# Patient Record
Sex: Female | Born: 1955
Health system: Southern US, Community
[De-identification: ages and names within clinical notes are randomized; demographics above are authoritative.]

## PROBLEM LIST (undated history)

## (undated) DIAGNOSIS — M719 Bursopathy, unspecified: Secondary | ICD-10-CM

## (undated) DIAGNOSIS — M545 Low back pain: Secondary | ICD-10-CM

## (undated) DIAGNOSIS — Z789 Other specified health status: Secondary | ICD-10-CM

## (undated) DIAGNOSIS — Z9889 Other specified postprocedural states: Secondary | ICD-10-CM

## (undated) DIAGNOSIS — M67919 Unspecified disorder of synovium and tendon, unspecified shoulder: Secondary | ICD-10-CM

## (undated) DIAGNOSIS — F329 Major depressive disorder, single episode, unspecified: Secondary | ICD-10-CM

## (undated) DIAGNOSIS — R112 Nausea with vomiting, unspecified: Secondary | ICD-10-CM

## (undated) HISTORY — DX: Unspecified disorder of synovium and tendon, unspecified shoulder: M67.919

## (undated) HISTORY — DX: Low back pain: M54.5

## (undated) HISTORY — PX: BREAST BIOPSY: SHX20

## (undated) HISTORY — DX: Bursopathy, unspecified: M71.9

## (undated) HISTORY — DX: Major depressive disorder, single episode, unspecified: F32.9

---

## 1975-04-25 HISTORY — PX: APPENDECTOMY: SHX54

## 1999-01-04 ENCOUNTER — Other Ambulatory Visit: Admission: RE | Admit: 1999-01-04 | Discharge: 1999-01-04 | Payer: Self-pay

## 1999-10-12 ENCOUNTER — Other Ambulatory Visit: Admission: RE | Admit: 1999-10-12 | Discharge: 1999-10-12 | Payer: Self-pay | Admitting: *Deleted

## 2000-03-02 ENCOUNTER — Encounter: Payer: Self-pay | Admitting: Family Medicine

## 2000-03-02 ENCOUNTER — Encounter: Admission: RE | Admit: 2000-03-02 | Discharge: 2000-03-02 | Payer: Self-pay | Admitting: Family Medicine

## 2000-03-07 ENCOUNTER — Encounter: Payer: Self-pay | Admitting: Family Medicine

## 2000-03-07 ENCOUNTER — Encounter: Admission: RE | Admit: 2000-03-07 | Discharge: 2000-03-07 | Payer: Self-pay | Admitting: Family Medicine

## 2000-08-27 ENCOUNTER — Encounter: Admission: RE | Admit: 2000-08-27 | Discharge: 2000-08-27 | Payer: Self-pay | Admitting: Family Medicine

## 2000-08-27 ENCOUNTER — Encounter: Payer: Self-pay | Admitting: Family Medicine

## 2001-03-15 ENCOUNTER — Encounter: Admission: RE | Admit: 2001-03-15 | Discharge: 2001-03-15 | Payer: Self-pay | Admitting: Family Medicine

## 2001-03-15 ENCOUNTER — Encounter: Payer: Self-pay | Admitting: Family Medicine

## 2001-10-17 ENCOUNTER — Encounter: Admission: RE | Admit: 2001-10-17 | Discharge: 2001-10-17 | Payer: Self-pay | Admitting: Family Medicine

## 2001-10-17 ENCOUNTER — Encounter: Payer: Self-pay | Admitting: Family Medicine

## 2002-03-18 ENCOUNTER — Encounter: Admission: RE | Admit: 2002-03-18 | Discharge: 2002-03-18 | Payer: Self-pay | Admitting: Obstetrics and Gynecology

## 2002-03-18 ENCOUNTER — Encounter: Payer: Self-pay | Admitting: Obstetrics and Gynecology

## 2002-05-20 LAB — HM DEXA SCAN: HM Dexa Scan: BORDERLINE

## 2002-05-27 ENCOUNTER — Other Ambulatory Visit: Admission: RE | Admit: 2002-05-27 | Discharge: 2002-05-27 | Payer: Self-pay | Admitting: Obstetrics and Gynecology

## 2002-12-05 ENCOUNTER — Encounter: Payer: Self-pay | Admitting: Family Medicine

## 2002-12-05 ENCOUNTER — Encounter: Admission: RE | Admit: 2002-12-05 | Discharge: 2002-12-05 | Payer: Self-pay | Admitting: Family Medicine

## 2003-03-23 ENCOUNTER — Encounter: Admission: RE | Admit: 2003-03-23 | Discharge: 2003-03-23 | Payer: Self-pay | Admitting: Obstetrics and Gynecology

## 2003-04-01 ENCOUNTER — Encounter: Admission: RE | Admit: 2003-04-01 | Discharge: 2003-04-01 | Payer: Self-pay | Admitting: Obstetrics and Gynecology

## 2003-06-23 ENCOUNTER — Other Ambulatory Visit: Admission: RE | Admit: 2003-06-23 | Discharge: 2003-06-23 | Payer: Self-pay | Admitting: Obstetrics and Gynecology

## 2003-11-17 ENCOUNTER — Encounter: Admission: RE | Admit: 2003-11-17 | Discharge: 2003-11-17 | Payer: Self-pay | Admitting: Family Medicine

## 2004-05-20 LAB — HM COLONOSCOPY: HM Colonoscopy: NEGATIVE

## 2004-08-02 ENCOUNTER — Other Ambulatory Visit: Admission: RE | Admit: 2004-08-02 | Discharge: 2004-08-02 | Payer: Self-pay | Admitting: Obstetrics and Gynecology

## 2004-08-30 ENCOUNTER — Ambulatory Visit (HOSPITAL_COMMUNITY): Admission: RE | Admit: 2004-08-30 | Discharge: 2004-08-30 | Payer: Self-pay | Admitting: Obstetrics and Gynecology

## 2005-03-28 ENCOUNTER — Ambulatory Visit (HOSPITAL_COMMUNITY): Admission: RE | Admit: 2005-03-28 | Discharge: 2005-03-28 | Payer: Self-pay | Admitting: Orthopedic Surgery

## 2005-03-28 ENCOUNTER — Ambulatory Visit (HOSPITAL_BASED_OUTPATIENT_CLINIC_OR_DEPARTMENT_OTHER): Admission: RE | Admit: 2005-03-28 | Discharge: 2005-03-28 | Payer: Self-pay | Admitting: Orthopedic Surgery

## 2006-08-14 ENCOUNTER — Inpatient Hospital Stay (HOSPITAL_COMMUNITY): Admission: EM | Admit: 2006-08-14 | Discharge: 2006-08-18 | Payer: Self-pay | Admitting: Emergency Medicine

## 2006-08-15 ENCOUNTER — Ambulatory Visit: Payer: Self-pay | Admitting: Internal Medicine

## 2006-08-15 ENCOUNTER — Encounter: Payer: Self-pay | Admitting: Internal Medicine

## 2006-09-03 ENCOUNTER — Encounter: Payer: Self-pay | Admitting: Family Medicine

## 2008-06-16 ENCOUNTER — Encounter: Admission: RE | Admit: 2008-06-16 | Discharge: 2008-06-16 | Payer: Self-pay | Admitting: Obstetrics and Gynecology

## 2008-06-22 LAB — CONVERTED CEMR LAB: Pap Smear: NORMAL

## 2008-11-13 ENCOUNTER — Ambulatory Visit: Payer: Self-pay | Admitting: Family Medicine

## 2008-11-13 DIAGNOSIS — F3289 Other specified depressive episodes: Secondary | ICD-10-CM

## 2008-11-13 DIAGNOSIS — M545 Low back pain, unspecified: Secondary | ICD-10-CM | POA: Insufficient documentation

## 2008-11-13 DIAGNOSIS — F329 Major depressive disorder, single episode, unspecified: Secondary | ICD-10-CM

## 2008-11-13 HISTORY — DX: Low back pain, unspecified: M54.50

## 2008-11-13 HISTORY — DX: Major depressive disorder, single episode, unspecified: F32.9

## 2008-11-13 HISTORY — DX: Other specified depressive episodes: F32.89

## 2009-10-19 ENCOUNTER — Ambulatory Visit: Payer: Self-pay | Admitting: Family Medicine

## 2009-10-19 LAB — CONVERTED CEMR LAB
ALT: 17 units/L (ref 0–35)
AST: 22 units/L (ref 0–37)
Albumin: 4.4 g/dL (ref 3.5–5.2)
Alkaline Phosphatase: 54 units/L (ref 39–117)
BUN: 15 mg/dL (ref 6–23)
Basophils Absolute: 0.1 10*3/uL (ref 0.0–0.1)
Basophils Relative: 1.3 % (ref 0.0–3.0)
Bilirubin Urine: NEGATIVE
Bilirubin, Direct: 0.1 mg/dL (ref 0.0–0.3)
Blood in Urine, dipstick: NEGATIVE
CO2: 29 meq/L (ref 19–32)
Calcium: 9.7 mg/dL (ref 8.4–10.5)
Chloride: 109 meq/L (ref 96–112)
Cholesterol: 182 mg/dL (ref 0–200)
Creatinine, Ser: 0.9 mg/dL (ref 0.4–1.2)
Eosinophils Absolute: 0.1 10*3/uL (ref 0.0–0.7)
Eosinophils Relative: 2.6 % (ref 0.0–5.0)
GFR calc non Af Amer: 68.54 mL/min (ref >60)
Glucose, Bld: 92 mg/dL (ref 70–99)
Glucose, Urine, Semiquant: NEGATIVE
HCT: 37 % (ref 36.0–46.0)
HDL: 73.9 mg/dL (ref >39.00)
Hemoglobin: 12.7 g/dL (ref 12.0–15.0)
Ketones, urine, test strip: NEGATIVE
LDL Cholesterol: 98 mg/dL (ref 0–99)
Lymphocytes Relative: 32.3 % (ref 12.0–46.0)
Lymphs Abs: 1.6 10*3/uL (ref 0.7–4.0)
MCHC: 34.5 g/dL (ref 30.0–36.0)
MCV: 94.9 fL (ref 78.0–100.0)
Monocytes Absolute: 0.4 10*3/uL (ref 0.1–1.0)
Monocytes Relative: 7.6 % (ref 3.0–12.0)
Neutro Abs: 2.8 10*3/uL (ref 1.4–7.7)
Neutrophils Relative %: 56.2 % (ref 43.0–77.0)
Nitrite: NEGATIVE
Platelets: 212 10*3/uL (ref 150.0–400.0)
Potassium: 5.5 meq/L — ABNORMAL HIGH (ref 3.5–5.1)
Protein, U semiquant: NEGATIVE
RBC: 3.9 M/uL (ref 3.87–5.11)
RDW: 13.8 % (ref 11.5–14.6)
Sodium: 143 meq/L (ref 135–145)
Specific Gravity, Urine: 1.02
TSH: 0.91 microintl units/mL (ref 0.35–5.50)
Total Bilirubin: 0.5 mg/dL (ref 0.3–1.2)
Total CHOL/HDL Ratio: 2
Total Protein: 7.3 g/dL (ref 6.0–8.3)
Triglycerides: 51 mg/dL (ref 0.0–149.0)
Urobilinogen, UA: 0.2
VLDL: 10.2 mg/dL (ref 0.0–40.0)
WBC: 4.9 10*3/uL (ref 4.5–10.5)
pH: 5.5

## 2009-10-22 ENCOUNTER — Ambulatory Visit: Payer: Self-pay | Admitting: Family Medicine

## 2010-03-23 ENCOUNTER — Ambulatory Visit: Payer: Self-pay | Admitting: Family Medicine

## 2010-03-23 DIAGNOSIS — M67919 Unspecified disorder of synovium and tendon, unspecified shoulder: Secondary | ICD-10-CM

## 2010-03-23 DIAGNOSIS — M719 Bursopathy, unspecified: Secondary | ICD-10-CM

## 2010-03-23 HISTORY — DX: Unspecified disorder of synovium and tendon, unspecified shoulder: M67.919

## 2010-03-23 HISTORY — DX: Unspecified disorder of synovium and tendon, unspecified shoulder: M71.9

## 2010-04-11 ENCOUNTER — Encounter: Payer: Self-pay | Admitting: Family Medicine

## 2010-04-20 ENCOUNTER — Telehealth: Payer: Self-pay | Admitting: Family Medicine

## 2010-04-27 ENCOUNTER — Ambulatory Visit
Admission: RE | Admit: 2010-04-27 | Discharge: 2010-04-27 | Payer: Self-pay | Source: Home / Self Care | Attending: Family Medicine | Admitting: Family Medicine

## 2010-04-27 DIAGNOSIS — T881XXA Other complications following immunization, not elsewhere classified, initial encounter: Secondary | ICD-10-CM | POA: Insufficient documentation

## 2010-05-15 ENCOUNTER — Encounter: Payer: Self-pay | Admitting: Family Medicine

## 2010-05-15 ENCOUNTER — Encounter: Payer: Self-pay | Admitting: Gastroenterology

## 2010-05-15 ENCOUNTER — Encounter: Payer: Self-pay | Admitting: Obstetrics and Gynecology

## 2010-05-17 ENCOUNTER — Encounter: Payer: Self-pay | Admitting: Family Medicine

## 2010-05-17 ENCOUNTER — Ambulatory Visit
Admission: RE | Admit: 2010-05-17 | Discharge: 2010-05-17 | Payer: Self-pay | Source: Home / Self Care | Attending: Family Medicine | Admitting: Family Medicine

## 2010-05-17 DIAGNOSIS — H8110 Benign paroxysmal vertigo, unspecified ear: Secondary | ICD-10-CM | POA: Insufficient documentation

## 2010-05-18 ENCOUNTER — Telehealth: Payer: Self-pay | Admitting: Family Medicine

## 2010-05-24 NOTE — Assessment & Plan Note (Signed)
Summary: shoulder pain for 5-6 months/cjr   Vital Signs:  Patient profile:   55 year old female Menstrual status:  postmenopausal Weight:      128 pounds Temp:     98.1 degrees F oral BP sitting:   120 / 80  (left arm) Cuff size:   regular  Vitals Entered By: Sid Falcon LPN (March 23, 2010 9:32 AM)  History of Present Illness: Right shoulder pain for 5-6 months. Initial injury was lifting heavy bag. Pain stays in the right shoulder without radiation. Deep achy pain of moderate severity. Pain worse with abduction and slightly with internal rotation. No neck pain. No alleviating factors.  Allergies: 1)  Codeine Sulfate (Codeine Sulfate)  Past History:  Past Medical History: Last updated: 11/13/2008 Chicken pox Depression Migraines  Past Surgical History: Last updated: 11/13/2008 Appendectomy Exploratory 1978 Foot, 2006 Ruptured ovary 2008  Family History: Last updated: 10/22/2009 Family history: Cervical cancer, grandmother Heart disease parent Father 64 CAD, Brother 10 CAD  Stroke, grandparent Sudden death parent, blood relative Emotional illness Diabetes, grandparent  Social History: Last updated: 11/13/2008 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 Alcohol use-yes 2 children  Risk Factors: Smoking Status: never (10/22/2009)  Review of Systems  The patient denies anorexia, weight loss, hoarseness, chest pain, dyspnea on exertion, and prolonged cough.    Physical Exam  General:  Well-developed,well-nourished,in no acute distress; alert,appropriate and cooperative throughout examination Neck:  No deformities, masses, or tenderness noted. Lungs:  Normal respiratory effort, chest expands symmetrically. Lungs are clear to auscultation, no crackles or wheezes. Heart:  Normal rate and regular rhythm. S1 and S2 normal without gallop, murmur, click, rub or other extra sounds. Extremities:  right shoulder reveals restricted range of motion with  abduction greater than about 70. No a.c. joint tenderness. No bicipital or triceps tenderness. Question of some rotator cuff weakness versus limitation secondary to pain   Impression & Recommendations:  Problem # 1:  ROTATOR CUFF SYNDROME (ICD-726.10) discussed the fact that rotator cuff tendinitis and partial tear of rotator cuff can be difficult to distinguish. We recommend a trial of physical therapy and corticosteroid injection. After discussing risks and benefits patient consented to steroid injection right shoulder. Shoulder prepped with Betadine. Using posterior lateral approach injected 40 mg Depo-Medrol and 2 cc plain Xylocaine using 25-gauge 1-1/2 inch needle. Patient tolerated well Orders: Joint Aspirate / Injection, Large (20610) Physical Therapy Referral (PT)  Complete Medication List: 1)  Fluoxetine Hcl 10 Mg Tabs (Fluoxetine hcl) .... One by mouth once daily  Patient Instructions: 1)  Be in touch if not improving over the next 2-3 weeks with injection and PT.   Orders Added: 1)  Joint Aspirate / Injection, Large [20610] 2)  Physical Therapy Referral [PT]

## 2010-05-24 NOTE — Assessment & Plan Note (Signed)
Summary: CPX // RS   Vital Signs:  Patient profile:   55 year old female Menstrual status:  postmenopausal Height:      65.60 inches Weight:      126 pounds BMI:     20.66 Temp:     98 degrees F oral Pulse rate:   60 / minute Pulse rhythm:   regular Resp:     12 per minute BP sitting:   120 / 80  (left arm) Cuff size:   regular  Vitals Entered By: Sid Falcon LPN (October 22, 452 11:55 AM) [facility]     Menstrual Status postmenopausal Last PAP Result normal   History of Present Illness: Here for CPE.   Sees gyn yearly for pap and breast exam/mammogram.  Generally doing well but some depressive symptoms off and on past year. Has been treated for depression in past.  She would like to consider going back on low dose Prozac which had helped in past.  no suicidal ideation.  Divorce during the past year. Exercises regularly with walking.  PMH, FH, AND SH reviewed.  Preventive Screening-Counseling & Management  Alcohol-Tobacco     Smoking Status: never  Clinical Review Panels:  Prevention   Last Mammogram:  normal (06/22/2008)   Last Pap Smear:  normal (06/22/2008)   Last Colonoscopy:  normal (04/24/2005)  Lipid Management   Cholesterol:  182 (10/19/2009)   LDL (bad choesterol):  98 (10/19/2009)   HDL (good cholesterol):  73.90 (10/19/2009)  CBC   WBC:  4.9 (10/19/2009)   RBC:  3.90 (10/19/2009)   Hgb:  12.7 (10/19/2009)   Hct:  37.0 (10/19/2009)   Platelets:  212.0 (10/19/2009)   MCV  94.9 (10/19/2009)   MCHC  34.5 (10/19/2009)   RDW  13.8 (10/19/2009)   PMN:  56.2 (10/19/2009)   Lymphs:  32.3 (10/19/2009)   Monos:  7.6 (10/19/2009)   Eosinophils:  2.6 (10/19/2009)   Basophil:  1.3 (10/19/2009)  Complete Metabolic Panel   Glucose:  92 (10/19/2009)   Sodium:  143 (10/19/2009)   Potassium:  5.5 (10/19/2009)   Chloride:  109 (10/19/2009)   CO2:  29 (10/19/2009)   BUN:  15 (10/19/2009)   Creatinine:  0.9 (10/19/2009)   Albumin:  4.4 (10/19/2009)  Total Protein:  7.3 (10/19/2009)   Calcium:  9.7 (10/19/2009)   Total Bili:  0.5 (10/19/2009)   Alk Phos:  54 (10/19/2009)   SGPT (ALT):  17 (10/19/2009)   SGOT (AST):  22 (10/19/2009)   Allergies: 1)  Codeine Sulfate (Codeine Sulfate)  Past History:  Past Medical History: Last updated: 11/13/2008 Chicken pox Depression Migraines  Past Surgical History: Last updated: 11/13/2008 Appendectomy Exploratory 1978 Foot, 2006 Ruptured ovary 2008  Family History: Last updated: 10/22/2009 Family history: Cervical cancer, grandmother Heart disease parent Father 56 CAD, Brother 82 CAD  Stroke, grandparent Sudden death parent, blood relative Emotional illness Diabetes, grandparent  Social History: Last updated: 11/13/2008 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 Alcohol use-yes 2 children  Risk Factors: Smoking Status: never (10/22/2009) PMH-FH-SH reviewed for relevance  Family History: Family history: Cervical cancer, grandmother Heart disease parent Father 5 CAD, Brother 17 CAD  Stroke, grandparent Sudden death parent, blood relative Emotional illness Diabetes, grandparent  Review of Systems  The patient denies anorexia, fever, weight loss, weight gain, vision loss, decreased hearing, hoarseness, chest pain, syncope, dyspnea on exertion, peripheral edema, prolonged cough, headaches, hemoptysis, abdominal pain, melena, hematochezia, severe indigestion/heartburn, hematuria, incontinence, genital sores, muscle weakness, suspicious skin  lesions, transient blindness, difficulty walking, depression, unusual weight change, abnormal bleeding, enlarged lymph nodes, and breast masses.    Physical Exam  General:  Well-developed,well-nourished,in no acute distress; alert,appropriate and cooperative throughout examination Head:  Normocephalic and atraumatic without obvious abnormalities. No apparent alopecia or balding. Eyes:  No corneal or conjunctival inflammation  noted. EOMI. Perrla. Funduscopic exam benign, without hemorrhages, exudates or papilledema. Vision grossly normal. Ears:  External ear exam shows no significant lesions or deformities.  Otoscopic examination reveals clear canals, tympanic membranes are intact bilaterally without bulging, retraction, inflammation or discharge. Hearing is grossly normal bilaterally. Mouth:  Oral mucosa and oropharynx without lesions or exudates.  Teeth in good repair. Neck:  No deformities, masses, or tenderness noted. Breasts:  gyn Lungs:  Normal respiratory effort, chest expands symmetrically. Lungs are clear to auscultation, no crackles or wheezes. Heart:  Normal rate and regular rhythm. S1 and S2 normal without gallop, murmur, click, rub or other extra sounds. Abdomen:  Bowel sounds positive,abdomen soft and non-tender without masses, organomegaly or hernias noted. Genitalia:  gyn Msk:  No deformity or scoliosis noted of thoracic or lumbar spine.   Extremities:  No clubbing, cyanosis, edema, or deformity noted with normal full range of motion of all joints.   Neurologic:  alert & oriented X3, cranial nerves II-XII intact, strength normal in all extremities, and gait normal.   Skin:  no rashes.   Cervical Nodes:  No lymphadenopathy noted Psych:  normally interactive, good eye contact, not anxious appearing, and not depressed appearing.     Impression & Recommendations:  Problem # 1:  ROUTINE GENERAL MEDICAL EXAM@[email or web address]  CARE FACL (ICD-V70.0) labs all OK.  Tetanus booster given.  Cont reg exercise.  Ca supplement discussed.  Cont gyn follow up.  Problem # 2:  DEPRESSION (ICD-311) start back prozac and touch base if not improved in 4 weeks. Her updated medication list for this problem includes:    Fluoxetine Hcl 10 Mg Tabs (Fluoxetine hcl) ..... One by mouth once daily  Complete Medication List: 1)  Fluoxetine Hcl 10 Mg Tabs (Fluoxetine hcl) .... One by mouth once daily  Other Orders: Tdap => 89yrs IM  (40102) Admin 1st Vaccine (72536)  Patient Instructions: 1)  It is important that you exercise reguarly at least 20 minutes 5 times a week. If you develop chest pain, have severe difficulty breathing, or feel very tired, stop exercising immediately and seek medical attention.  2)  Take calcium +vitamin D daily.  Prescriptions: FLUOXETINE HCL 10 MG TABS (FLUOXETINE HCL) one by mouth once daily  #30 x 11   Entered and Authorized by:   Evelena Peat MD   Signed by:   Evelena Peat MD on 10/22/2009   Method used:   [facility]        [facility]  [address] [phone][facility] (retail[facility]       [address] [place], [place]  [address]       Ph: [phone] or [phone]       Fax: [phone]   RxID:   [id number]      Immunizations Administered:  Tetanus Vaccine:    Vaccine Type: Tdap    Site: left deltoid    Mfr: GlaxoSmithKline    Dose: 0.5 ml    Route: IM    Given by: Sid Falcon LPN    Exp. Date: 08/16/2011    Lot #: [id number]

## 2010-05-26 NOTE — Progress Notes (Signed)
Summary: Ongoing dizziness today  Phone Note Call from Patient Call back at Work Phone [phone]   Caller: Patient---live call Summary of Call: wants to speak with nancy. has questions. Initial call taken by: Warnell Forester,  May 18, 2010 9:55 AM  Follow-up for Phone Call        Pt called reporting ongoing symptoms of being really dizzy again today, pressure on the right side of face.  Questioning need for Z-pack Follow-up by: Sid Falcon LPN,  May 18, 2010 3:46 PM  Additional Follow-up for Phone Call Additional follow up Details #1::        Zithromax with not help her Vertigo .  Consideration for Zithromax would be if she is having several days of localized facial pain, purulent nasal secretions, or progressive sinus headache.  Focus on good hydration  and be in touch if symptom persist. Additional Follow-up by: Evelena Peat MD,  May 18, 2010 5:43 PM    Additional Follow-up for Phone Call Additional follow up Details #2::    Pt informed on personally identified VM Follow-up by: Sid Falcon LPN,  May 18, 2010 5:46 PM

## 2010-05-26 NOTE — Progress Notes (Signed)
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 Pt is going to need immunizations and any med that she make need for going to that country. Pt would like to know if that is something she get here, or does she need to go to health dept? Initial call taken by: Lucy Antigua,  April 20, 2010 9:36 AM  Follow-up for Phone Call        We should be able to give here.  Schedule follow up for these no later than January 15. Follow-up by: Evelena Peat MD,  April 20, 2010 1:05 PM  Additional Follow-up for Phone Call Additional follow up Details #1::        Lft vm for pt to cb to sch.  Additional Follow-up by: Lucy Antigua,  April 20, 2010 1:34 PM    Additional Follow-up for Phone Call Additional follow up Details #2::    Pt called back and has been sch for fup and immunization for trip to [place]. Appt on 04/27/10 at 1:15, as noted.  Follow-up by: Lucy Antigua,  April 20, 2010 2:46 PM

## 2010-05-26 NOTE — Miscellaneous (Signed)
Summary: PT Eval/[facility]  PT Eval/[facility]   Imported By: Lanelle Bal 04/15/2010 09:10:35  _____________________________________________________________________  External Attachment:    Type:   Image     Comment:   External Document

## 2010-05-26 NOTE — Assessment & Plan Note (Signed)
Summary: VERTIGO?  // RS   Vital Signs:  Patient profile:   55 year old female Menstrual status:  postmenopausal Weight:      126 pounds Temp:     97.9 degrees F oral BP sitting:   118 / 72  (left arm) Cuff size:   regular  Vitals Entered By: Sid Falcon LPN (May 17, 2010 12:03 PM)  History of Present Illness: Pt here with vertigo witih onset last Sat night (3 days ago). First noted symptoms when rolling over in bed.  Had some  mild nausea and one episode of vomiting.  Symptoms worse with movement of head to R.  No fever, headaches, focal weakness, ataxia, diplopia, speech changes.  Symptoms somewhat better now.  No hearing changes.  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 No apparent alopecia or balding. Eyes:  pupils equal, pupils round, and pupils reactive to light.   Ears:  External ear exam shows no significant lesions or deformities.  Otoscopic examination reveals clear canals, tympanic membranes are intact bilaterally without bulging, retraction, inflammation or discharge. Hearing is grossly normal  bilaterally. Mouth:  Oral mucosa and oropharynx without lesions or exudates.  Teeth in good repair. Neck:  No deformities, masses, or tenderness noted. Lungs:  Normal respiratory effort, chest expands symmetrically. Lungs are clear to auscultation, no crackles or wheezes. Heart:  normal rate and regular rhythm.   Neurologic:  alert & oriented X3, cranial nerves II-XII intact, strength normal in all extremities, and gait normal.   Pt has mild vertigo with lying supine and sitting up with head to R.   Impression & Recommendations:  Problem # 1:  BENIGN POSITIONAL VERTIGO (ICD-386.11) pt reassured.  Observe for now.  Complete Medication List: 1)  Fluoxetine Hcl 10 Mg Tabs (Fluoxetine hcl) .... One by mouth once daily 2)  Vivotif Berna Vaccine Cpdr (Typhoid vaccine) .... One by mouth every other day for 4 doses and take at least 2 weeks prior to departure. 3)  Ciprofloxacin Hcl 500 Mg Tabs (Ciprofloxacin hcl) .... One by mouth two times a day for 3 days 4)  Malarone 250-100 Mg Tabs (Atovaquone-proguanil hcl) .... One by mouth once daily and start 1-2 days prior to departure and continue daily until one week after return.   Orders Added: 1)  Est. Patient Level III [16109]

## 2010-05-26 NOTE — Assessment & Plan Note (Signed)
Summary: fup/immunizations for trip to [place] and any med needed/per Dr...   Vital Signs:  Patient profile:   55 year old female Menstrual status:  postmenopausal Weight:      127 pounds Temp:     98.0 degrees F oral BP sitting:   100 / 74  (left arm) Cuff size:   regular  Vitals Entered By: Sid Falcon LPN (April 27, 2010 1:34 PM)  History of Present Illness: Here to discuss immunizations.  Trip to [place] in Feb.  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  Tetanus up to date. No Hep A or typhoid vaccine previously. also no flu vaccine yet.  No clear indications for Hep B series. Will also need malaria prophylaxis.  She has not taken antimalaria drugs previously.  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    Physical Exam  General:  Well-developed,well-nourished,in no acute distress; alert,appropriate and cooperative throughout examination Neck:  No deformities, masses, or tenderness noted. Lungs:  Normal respiratory effort, chest expands symmetrically. Lungs are clear to auscultation, no crackles or wheezes. Heart:  Normal rate and regular rhythm. S1  and S2 normal without gallop, murmur, click, rub or other extra sounds. Extremities:  No clubbing, cyanosis, edema, or deformity noted with normal full range of motion of all joints.     Impression & Recommendations:  Problem # 1:  GENERALIZED VACCINIA AS COMP MEDICAL CARE NEC (ICD-999.0) Hep A, influenza, Vivotif, and malarone rx.  Also Cipro to use as needed travelers diarrhea. She will start Vivotif now and wait to start Malarone 2 days prior to departure.  Complete Medication List: 1)  Fluoxetine Hcl 10 Mg Tabs (Fluoxetine hcl) .... One by mouth once daily 2)  Vivotif Berna Vaccine Cpdr (Typhoid vaccine) .... One by mouth every other day for 4 doses and take at least 2 weeks prior to departure. 3)  Ciprofloxacin Hcl 500 Mg Tabs (Ciprofloxacin hcl) .... One by mouth two times a day for 3 days 4)  Malarone 250-100 Mg Tabs (Atovaquone-proguanil hcl) .... One by mouth once daily and start 1-2 days prior to departure and continue daily until one week after return.  Other Orders: Hepatitis A Vaccine (Adult Dose) (65784) Admin 1st Vaccine (69629) Flu Vaccine 42yrs + (52841) Admin of Any Addtl Vaccine (32440) Prescriptions: VIVOTIF BERNA VACCINE  CPDR (TYPHOID VACCINE) one by mouth every other day for 4 doses and take at least 2 weeks prior to departure.  #4 x 0   Entered and Authorized by:   Evelena Peat MD   Signed by:   Evelena Peat MD on 04/27/2010   Method used:   Print then Give to Patient   RxID:   [id number] MALARONE 250-100 MG TABS (ATOVAQUONE-PROGUANIL HCL) one by mouth once daily and start 1-2 days prior to departure and continue daily until one week after  return.  #20 x 0   Entered and Authorized by:   Evelena Peat MD   Signed by:   Evelena Peat MD on 04/27/2010   Method used:   Print then Give to Patient   RxID:   [id number] CIPROFLOXACIN HCL 500 MG TABS (CIPROFLOXACIN HCL) one by mouth two times a day for 3 days  #6 x 0   Entered and Authorized by:    Evelena Peat MD   Signed by:   Evelena Peat MD on 04/27/2010   Method used:   [facility]        [facility]  [address] [phone][facility]       [address] [place], [place]  [address]       Ph: [phone] or [phone]       Fax: [phone]   RxID:   [id number]    Orders Added: 1)  Est. Patient Level III [87564] 2)  Hepatitis A Vaccine (Adult Dose) [90632] 3)  Admin 1st Vaccine [90471] 4)  Flu Vaccine 49yrs + [33295] 5)  Admin of Any Addtl Vaccine [18841]   Immunizations Administered:  Hepatitis A Vaccine # 1:    Vaccine Type: HepA    Site: right deltoid    Mfr: GlaxoSmithKline    Dose: 1.0 ml    Route: IM    Given by: Sid Falcon LPN    Exp. Date: 02/23/2012    Lot #: [id number]    VIS given: 07/12/04 version given April 27, 2010.  Influenza Vaccine # 1:    Vaccine Type: Fluvax 3+    Site: left deltoid    Mfr: Sanofi Pasteur    Dose: 0.5 ml    Route: IM    Given by: Sid Falcon LPN    Exp. Date: 09/23/2010    Lot #: [id number]    VIS given: 11/16/09 version given April 27, 2010.   Immunizations Administered:  Hepatitis A Vaccine # 1:    Vaccine Type: HepA    Site: right deltoid    Mfr: GlaxoSmithKline    Dose: 1.0 ml    Route: IM    Given by: Sid Falcon LPN    Exp. Date: 02/23/2012    Lot #: Leeanne Rio    VIS given: 07/12/04 version given April 27, 2010.  Influenza Vaccine # 1:    Vaccine Type: Fluvax 3+    Site: left deltoid    Mfr: Sanofi Pasteur    Dose: 0.5 ml    Route: IM    Given by: Sid Falcon LPN    Exp. Date: 09/23/2010    Lot #: [id number]    VIS given: 11/16/09 version given April 27, 2010.  Prevention & Chronic Care Immunizations   Influenza vaccine: Fluvax 3+  (04/27/2010)    Tetanus booster: 10/22/2009: Tdap    Pneumococcal vaccine: Not documented  Colorectal Screening   Hemoccult: Not documented    Colonoscopy: normal  (04/24/2005)  Other Screening   Pap smear: normal   (06/22/2008)    Mammogram: normal  (06/22/2008)   Smoking status: never  (10/22/2009)  Lipids   Total Cholesterol: 182  (10/19/2009)   LDL: 98  (10/19/2009)   LDL Direct: Not documented   HDL: 73.90  (10/19/2009)   Triglycerides: 51.0  (10/19/2009)

## 2010-09-09 NOTE — H&P (Signed)
NAME:  Vanessa Mccoy, Vanessa Mccoy NO.:  [id number]   MEDICAL RECORD NO.:  [id number]          PATIENT TYPE:  EMS   LOCATION:  ED                           FACILITY:  [facility]   PHYSICIAN:  Lonia Blood, M.D.       DATE OF BIRTH:  06-30-1955   DATE OF ADMISSION:  08/14/2006  DATE OF DISCHARGE:                              HISTORY & PHYSICAL   PRIMARY CARE PHYSICIAN:  Dr. Evelena Peat, M.D. with [facility]   CHIEF COMPLAINTS:  Abdominal pain.   HISTORY OF PRESENT ILLNESS:  Vanessa Mccoy is a 55 year old woman  without any major past medical history, who presents to [facility] after acute onset of lower abdominal pain.  The patient  describes the pain as extremely severe and located sort of in the  suprapubic area.  The patient reports that she does not have nausea and  vomiting and she did not have diarrhea.  She reports that her abdominal  pain does not irradiate from the suprapubic area.  The patient reports  that she had sort of a similar episode in 1973 and at the time she had  an exploratory  laparotomy without a significant diagnosis after the  surgery.   MEDICATIONS:  None.   PAST MEDICAL HISTORY:  Nothing significant.   SOCIAL HISTORY:  The patient is separated.  She has two grown-up  children.  She does smoke cigarettes.  Drinks occasional glass of wine.   FAMILY HISTORY:  The patient's father died with coronary artery disease  at age 62.  The patient's brother died of coronary disease at age 74.  The patient's mother is alive and healthy.   REVIEW OF SYSTEMS:  As per HPI.  Other systems:  The patient reports  maybe a low grade fever.  She has also history of exploratory  laparoscopy to rule out endometriosis about 3 years ago.   PHYSICAL EXAMINATION:  VITAL SIGNS:  Temperature of 99.8, blood pressure  117/66, pulse 84, respirations 24, saturation 100% on room air.  GENERAL APPEARANCE:  Anxious woman, lying on the stretcher,  alert,  oriented to place, person, and time.  HEENT:  Head normocephalic, atraumatic. Eyes: Pupils equal, round, react  to light and accommodation.  Extraocular movements intact.  Throat  clear.  NECK:  Supple.  No JVD.  No carotid bruits.  CHEST:  Clear to auscultation bilaterally without wheezes, rhonchi or  crackles.  HEART:  Regular rate and rhythm.  No murmurs, gallops  or rubs.  ABDOMEN:  The patient has diffuse tenderness, guarding throughout the  abdomen and I cannot assess any rebound tenderness due to patient's  guarding.  Bowel sounds are decreased.  EXTREMITIES:  Lower extremities have +1 bilateral edema.  SKIN:  Warm, dry without any suspicious rashes.   LABORATORY VALUES:  On admission urinalysis within normal limits.  Sodium 135, potassium 3.7, chloride 104, bicarb 25, BUN 10, creatinine  0.8, white blood cell count 9, hemoglobin 13.3, platelet count 218,000.  Abdominal CT shows some periportal edema, prominent venous structures  around the  hilum of the spleen.  The IVC is prominent.  Dilated loops of  the distal ileum.  No obvious obstruction or wall thickening.  Large  amount of fecal material in the right colon.  Pelvic ultrasound shows  normal uterus and nonvisualized ovaries and dilated loops of bowel.   ASSESSMENT/PLAN:  1. Abdominal pain with acute onset, unclear etiology.  At this point      in time, we cannot fully rule out appendicitis although I  kind of      doubt it without leukocytosis and without CT scan fidings to      suggest it.  This process seems to be a small bowel process but the      etiology of it is obscure to me at this point.  The plan is to      admit the patient to a regular medical/surgical floor, place her on      intravenous fluids, intravenous antibiotics and obtain a surgical      consultation. Careful follow up abdominal exam will be performed.  2. Question of dilated inferior vena cava with possibility of a      pulmonary  hypertension.  The patient's chest x-ray and personal      medical history is pretty unremarkable.  Her cardiac exam is pretty      unremarkable.  I will obtain a transthoracic echocardiogram to rule      out pulmonary hypertension.      Lonia Blood, M.D.  Electronically Signed     SL/MEDQ  D:  08/14/2006  T:  08/15/2006  Job:  [id number]   cc:   Evelena Peat, M.D.

## 2010-09-09 NOTE — Op Note (Signed)
NAME:  Vanessa Mccoy, Vanessa Mccoy NO.:  [id number]   MEDICAL RECORD NO.:  [id number]          PATIENT TYPE:  AMB   LOCATION:  SDC                           FACILITY:  [facility]   PHYSICIAN:  Malva Limes, M.D.    DATE OF BIRTH:  [birth year]   DATE OF PROCEDURE:  08/30/2004  DATE OF DISCHARGE:                                 OPERATIVE REPORT   PREOPERATIVE DIAGNOSES:  1.  Chronic pelvic pain.  2.  Dyspareunia.  3.  History of abdominal surgery.   POSTOPERATIVE DIAGNOSES:  1.  Chronic pelvic pain.  2.  Dyspareunia.  3.  History of abdominal surgery.   PROCEDURE:  Diagnostic laparoscopy.   SURGEON:  Dr. Dareen Piano   ANESTHESIA:  General endotracheal.   ANTIBIOTICS:  Ancef 1 g.   ESTIMATED BLOOD LOSS:  Minimal.   SPECIMENS:  None.   DRAINS:  Red rubber catheter to bladder.   COMPLICATIONS:  None.   DESCRIPTION OF PROCEDURE:  The patient was taken to the operating room,  where she was placed in a dorsal supine position, and a general anesthetic  was administered without complications.  She was then placed in the  dorsolithotomy position.  Her bladder was drained with a red rubber  catheter.  A Hulka tenaculum was applied to the anterior cervical lip.  The  umbilicus was then injected with 0.25% Marcaine.  A vertical skin incision  was made.  This was carried down to the fascia.  The fascia was entered  sharply.  Parietal peritoneum was entered sharply.  Sutures were placed  laterally, and the Hasson cannula placed into the abdominal cavity.  Carbon  dioxide 3 L was then insufflated.  At this point, the patient was then  placed in Trendelenburg.  Examination revealed a normal anterior cul-de-sac,  normal uterus, fallopian tubes, and ovaries.  There were no adhesions or  endometriosis about the abdomen or pelvis.  Pelvic sidewalls were clear.  The patient did have pelvic congestion and a tortuous sigmoid colon.  Otherwise, the bowel was all free.  The liver and gallbladder  appear to be  normal.  The appendix appeared to be surgically absent.  No obvious etiology  could be identified for the patient's discomfort.  At this point,  pneumoperitoneum was released, instruments removed, and the fascia closed  with interrupted 0 Vicryl suture.  The skin was closed with interrupted 4-0  Rapide and Dermabond.  The patient tolerated the procedure well.  She will  be discharged to home.  She will be sent home with Percocet to take p.r.n.  She will follow up in the office in 2 weeks.      MA/MEDQ  D:  08/30/2004  T:  08/30/2004  Job:  [id number]

## 2010-09-09 NOTE — Op Note (Signed)
NAME:  Vanessa Mccoy, Vanessa Mccoy NO.:  [id number]   MEDICAL RECORD NO.:  [id number]          PATIENT TYPE:  AMB   LOCATION:  DSC                          FACILITY:  [facility]   PHYSICIAN:  Leonides Grills, M.D.     DATE OF BIRTH:  [birth year]   DATE OF PROCEDURE:  03/28/2005  DATE OF DISCHARGE:                                 OPERATIVE REPORT   PREOPERATIVE DIAGNOSIS:  Right hallux valgus.   POSTOPERATIVE DIAGNOSIS:  Right hallux valgus.   OPERATION:  1.  Right chevron bunionectomy.  2.  Stress x-rays, right foot.   ANESTHESIA:  General.   SURGEON:  Leonides Grills, M.D.   ASSISTANT:  Lianne Cure, P.A.   ESTIMATED BLOOD LOSS:  Minimal.   TOURNIQUET TIME:  Approximately half hour.   COMPLICATIONS:  None.   DISPOSITION:  Stable to PR.   INDICATIONS:  This is a 55 year old female who has had longstanding right  great toe pain despite wearing wider toe box shoes and anti-inflammatories.  She was consented for the above procedure.  All risks which include  infection, neurovascular injury, nonunion, malunion, hardware irritation,  hardware failure, persistent pain, worsening pain, recurrent stiffness,  arthritis, were all explained.  Questions were encouraged and answered.   OPERATION:  The patient was brought to the operating room and placed in a  supine position after adequate general endotracheal tube anesthesia was  administered as well as Ancef 1 g IV piggyback.  The right lower extremity  was then prepped and draped in a sterile manner where a proximally placed  thigh tourniquet.  The limb was gravity exsanguinated and tourniquet was  elevated to 290 mmHg.  Longitudinal incision was made in the midline over  the medial aspect of the great toe MTP joint was then made.  Dissection was  carried down through skin.  Hemostasis was obtained.  Neurovascular  structure were identified both superiorly and inferiorly and protected  throughout the case.  L-shaped capsulotomy  was then made.  Simple  bunionectomy was then performed with sagittal saw.  The lateral capsule was  then released with a curved Beaver blade.  The center of the head was then  identified and a chevron osteotomy was then created, protecting the soft  tissue both superiorly and inferiorly.  The head was then translated  approximately 3-4 mm laterally and then fixed with a 2.0 mm fully threaded  cortical set screw using 1.5 mm drill hole, respectively.  The redundant  bone medially was then trimmed off with sagittal jaw.  Rocky Link Johnson's ridge  was then rounded off with a rongeur.  The joint and capsule and area were  copiously irrigated with normal saline.  The capsule was then reconstructed  and advanced both superiorly and proximally and fixed with 2-0 Vicryl  suture.   Stress x-rays were then obtained in the AP and lateral planes and showed no  gross motion across the osteotomy site.  Fixation in proper position and  correction excellent as well.  The range of motion of the toe was excellent.  The area was copiously  irrigated one more time with normal saline.  Tourniquet was deflated.  Hemostasis was obtained.  The skin was closed with  4-0 nylon suture.  Sterile dressing was applied.  Roger Mann dressing was  applied.  Hard sole shoe was applied.  The patient was taken to the PR.      Leonides Grills, M.D.  Electronically Signed     PB/MEDQ  D:  03/28/2005  T:  03/28/2005  Job:  [id number]

## 2010-09-09 NOTE — Discharge Summary (Signed)
NAME:  Vanessa Mccoy, Vanessa Mccoy NO.:  [id number]   MEDICAL RECORD NO.:  [id number]          PATIENT TYPE:  INP   LOCATION:  1425                         FACILITY:  [facility]   PHYSICIAN:  Beckey Rutter, MD  DATE OF BIRTH:  1955-05-09   DATE OF ADMISSION:  08/14/2006  DATE OF DISCHARGE:  08/18/2006                               DISCHARGE SUMMARY   PRIMARY CARE PHYSICIAN:  [facility].   CHIEF COMPLAINT ON ADMISSION:  Abdominal pain.   HISTORY OF PRESENT ILLNESS:  [age]-year-old with no significant past  medical history came with right lower quadrant abdominal pain.  During  her hospitalization, the patient received antibiotic. The patient was  also seen and evaluated for acute abdominal pain by surgery, Dr.  Zachery Dakins, and evaluated as well by GYN.  It was presumed the patient  has ovarian cyst rupture, rather than diverticulitis.  The patient is  stable for discharge today.  She tolerated p.o. feeding okay, without  vomiting or nausea and without abdominal pain.   HOSPITAL PROCEDURES:  The patient had CT abdomen to rule out acute  abdomen on admission, showing:   1. COPD, no acute abnormality in the chest.  2. Air flow level in the right colon without bowel obstruction.  3. Then she had a CT scan for the abdomen and pelvis.  This was done      on April22,2008.  The impression reading:  A) Pre-portal      edema, etiology uncertain; prominent venous structure around the      hilum of the spleen, raising the question of portal hypertension,      but there are no other changes that suggest portal hypertension.      B) Prominent IVC, question right heart failure .  4. The pelvis CT with contrast.  Impression reading A) focal      dilatation of the sigmoid of the distal ileum without obvious      etiology.  Probably adhesions.  B) The appendix was not visualized.      Cannot exclude appendicitis.  C)  There is also possible thickening      of the sigmoid  colon raising the possibility of sigmoid      diverticulitis and there is even possibility of epiploic      appendicitis in the sigmoid.  Consider delayed imaging of the      contrast in the colon for further assessment.  5. The patient had pelvis ultrasound impression on the same day,      April22.  The impression is reading:  A) Normal uterus.  B)      neither ovary is visualized transabdominally or transvaginally.  C)      No adnexal mass.  D) Small amount of pelvic fluids.  E) There are      mildly dilated and possibly slightly thickened loops of bowel in      the cul-de-sac.  The patient had a follow-up abdominal x-ray done      on August 15, 2006, with impression of interval passage of oral  contrast into the colon with mild small bowel distension and air      fluid levels.   DISCHARGE DIAGNOSIS:  1. Abdominal pain, likely secondary to rupture in the ovarian cyst,      with the possibility of diverticulitis.  2. Anemia, likely iron deficiency anemia.   DISCHARGE MEDICATIONS:  1. Folic acid 1 mg p.o. daily.  2. Ferrous sulfate 325 mg p.o. three times a day.   DISCHARGE/PLAN:  The patient is stable for discharge today.  We will  discontinue the antibiotic and the patient will be discharged without  antibiotics.  The patient has evidence of iron deficiency anemia.  She  was started on iron and she was recommended to follow up with her 1)  primary physician 2) gastroenterologist for screening colonoscopy.   DISCHARGE LABS:  Fecal Hemoccult blood is negative.  In the anemia  panel, retic percent is 0.7.  The reticulocyte absolute count is 23.3.  Iron is 15, total iron binding capacity is 195.  Percent saturation is  8.  UIBC is 180.  Vitamin B12 was 520, serum  folate is 14.4.  Ferritin  is 163.  BMP showing sodium 143, potassium 3.1, chloride 110, CO2 27,  glucose 109, BUN 4, creatinine 0.67.  CBC on April25 showing white  blood count 7.4, hemoglobin 10.1, hematocrit 29.1,  platelet is 158.  Note:  The patient will be given potassium supplementation before  discharge.  She was encouraged to eat food with high potassium content.      Beckey Rutter, MD  Electronically Signed     EME/MEDQ  D:  08/18/2006  T:  08/18/2006  Job:  [id number]

## 2010-11-14 ENCOUNTER — Encounter: Payer: Self-pay | Admitting: Family Medicine

## 2010-11-14 ENCOUNTER — Ambulatory Visit (INDEPENDENT_AMBULATORY_CARE_PROVIDER_SITE_OTHER): Payer: No Typology Code available for payment source | Admitting: Family Medicine

## 2010-11-14 VITALS — BP 102/64 | Temp 98.7°F | Ht 65.5 in | Wt 127.0 lb

## 2010-11-14 DIAGNOSIS — J029 Acute pharyngitis, unspecified: Secondary | ICD-10-CM

## 2010-11-14 LAB — POCT RAPID STREP A (OFFICE): Rapid Strep A Screen: NEGATIVE

## 2010-11-14 NOTE — Patient Instructions (Signed)
Call if you develop any fever or if sore throat not improved by next week.

## 2010-11-14 NOTE — Progress Notes (Signed)
  Subjective:    Patient ID: Vanessa Mccoy, female    DOB: Jun 14, 1955, 55 y.o.   MRN: [id number]  HPI Patient seen with onset of illness about one week ago. Predominantly sore throat. She's had some mild headache and bodyaches. Intermittent earache. Minimal cough. No postnasal drip. Denies any sick contacts. Denies nausea, vomiting, or diarrhea. Over-the-counter analgesics with moderate relief.   Review of Systems  Constitutional: Negative for fever and chills.  HENT: Positive for sore throat. Negative for sinus pressure.   Respiratory: Negative for shortness of breath.   Cardiovascular: Negative for chest pain.  Genitourinary: Negative for dysuria.  Skin: Negative for rash.  Hematological: Negative for adenopathy. Does not bruise/bleed easily.       Objective:   Physical Exam  Constitutional: She appears well-developed and well-nourished.  HENT:  Right Ear: External ear normal.  Left Ear: External ear normal.       Minimal if any erythema. No exudate  Neck: Neck supple.       Minimal anterior cervical adenopathy. No posterior cervical nodes  Cardiovascular: Normal rate and regular rhythm.   Pulmonary/Chest: Effort normal and breath sounds normal. No respiratory distress. She has no wheezes. She has no rales.  Musculoskeletal: She exhibits no edema.  Skin: No rash noted.          Assessment & Plan:  Probable viral illness. Rapid strep negative. Treat symptomatically.

## 2010-12-06 ENCOUNTER — Other Ambulatory Visit: Payer: Self-pay | Admitting: Family Medicine

## 2011-02-23 HISTORY — PX: BREAST SURGERY: SHX581

## 2011-11-02 ENCOUNTER — Other Ambulatory Visit: Payer: Self-pay | Admitting: Obstetrics and Gynecology

## 2011-11-02 DIAGNOSIS — R928 Other abnormal and inconclusive findings on diagnostic imaging of breast: Secondary | ICD-10-CM

## 2011-11-07 ENCOUNTER — Other Ambulatory Visit: Payer: Self-pay | Admitting: Obstetrics and Gynecology

## 2011-11-07 ENCOUNTER — Ambulatory Visit
Admission: RE | Admit: 2011-11-07 | Discharge: 2011-11-07 | Disposition: A | Discharge status: Home / Self Care | Payer: BC Managed Care – PPO | Source: Ambulatory Visit | Attending: Obstetrics and Gynecology | Admitting: Obstetrics and Gynecology

## 2011-11-07 DIAGNOSIS — R921 Mammographic calcification found on diagnostic imaging of breast: Secondary | ICD-10-CM

## 2011-11-07 DIAGNOSIS — R928 Other abnormal and inconclusive findings on diagnostic imaging of breast: Secondary | ICD-10-CM

## 2011-11-27 ENCOUNTER — Other Ambulatory Visit: Payer: Self-pay | Admitting: Obstetrics and Gynecology

## 2011-11-27 ENCOUNTER — Ambulatory Visit
Admission: RE | Admit: 2011-11-27 | Discharge: 2011-11-27 | Disposition: A | Discharge status: Home / Self Care | Payer: BC Managed Care – PPO | Source: Ambulatory Visit | Attending: Obstetrics and Gynecology | Admitting: Obstetrics and Gynecology

## 2011-11-27 DIAGNOSIS — R921 Mammographic calcification found on diagnostic imaging of breast: Secondary | ICD-10-CM

## 2011-12-12 ENCOUNTER — Encounter (INDEPENDENT_AMBULATORY_CARE_PROVIDER_SITE_OTHER): Payer: Self-pay | Admitting: General Surgery

## 2011-12-12 ENCOUNTER — Ambulatory Visit (INDEPENDENT_AMBULATORY_CARE_PROVIDER_SITE_OTHER): Payer: BC Managed Care – PPO | Admitting: General Surgery

## 2011-12-12 VITALS — BP 118/58 | HR 64 | Temp 99.0°F | Resp 16 | Ht 66.0 in | Wt 126.8 lb

## 2011-12-12 DIAGNOSIS — R928 Other abnormal and inconclusive findings on diagnostic imaging of breast: Secondary | ICD-10-CM

## 2011-12-12 NOTE — Progress Notes (Signed)
Patient ID: Vanessa Mccoy, female   DOB: [birth year], 56 y.o.   MRN: [id number]  Chief Complaint  Patient presents with  . Other    eval lt breast for calcification    HPI Vanessa Mccoy is a 56 y.o. female.  She is referred by Dr. Dorita Fray at the Breast center of [facility] for evaluation of an abnormal mammogram of the left breast. Dr. Evelena Peat is her primary care physician.  The patient has minimal prior breast history. She states she had a right breast cyst aspirated many years ago. On 02/23/2011 she had bilateral breast augmentation by [facility], and she states that she has subcutaneous saline implants.  She had bilateral mammograms at [facility] in June was told there was an abnormality of the left. She had a left mammogram at the [facility] of [facility] in July and on August 5 she was brought back to the [facility]  where they described a focal area of punctate calcifications at the 6:00 position, subareolar on the left. Image guided biopsy was planned but the tissue was too thin for this to be performed. She was referred here for consideration of excisional biopsy.  Family history is negative for breast or ovarian cancer. The patient is otherwise fairly healthy. HPI  Past Medical History  Diagnosis Date  . DEPRESSION 11/13/2008  . Lumbago 11/13/2008  . ROTATOR CUFF SYNDROME 03/23/2010    Past Surgical History  Procedure Date  . Breast surgery     augmentation    Family History  Problem Relation Age of Onset  . Heart disease Mother   . Heart disease Brother   . Cancer Maternal Grandmother     cervical & lung  . Diabetes Maternal Grandfather   . Stroke Maternal Grandfather     Social History History  Substance Use Topics  . Smoking status: Never Smoker   . Smokeless tobacco: Never Used  . Alcohol Use: 0.0 oz/week     1-2 glasses of wine/ week    Allergies  Allergen Reactions  . Codeine Sulfate     REACTION: GI upset    Current  Outpatient Prescriptions  Medication Sig Dispense Refill  . FLUoxetine (PROZAC) 10 MG tablet TAKE 1 TABLET BY MOUTH EVERY DAY  30 tablet  6    Review of Systems Review of Systems  Constitutional: Negative for fever, chills and unexpected weight change.  HENT: Negative for hearing loss, congestion, sore throat, trouble swallowing and voice change.   Eyes: Negative for visual disturbance.  Respiratory: Negative for cough and wheezing.   Cardiovascular: Negative for chest pain, palpitations and leg swelling.  Gastrointestinal: Negative for nausea, vomiting, abdominal pain, diarrhea, constipation, blood in stool, abdominal distention and anal bleeding.  Genitourinary: Negative for hematuria, vaginal bleeding and difficulty urinating.  Musculoskeletal: Negative for arthralgias.  Skin: Negative for rash and wound.  Neurological: Negative for seizures, syncope and headaches.  Hematological: Negative for adenopathy. Does not bruise/bleed easily.  Psychiatric/Behavioral: Negative for confusion.    Blood pressure 118/58, pulse 64, temperature 99 F (37.2 C), temperature source Temporal, resp. rate 16, height 5\' 6"  (1.676 m), weight 126 lb 12.8 oz (57.516 kg).  Physical Exam Physical Exam  Constitutional: She is oriented to person, place, and time. She appears well-developed and well-nourished. No distress.  HENT:  Head: Normocephalic.  Nose: Nose normal.  Mouth/Throat: No oropharyngeal exudate.  Eyes: Conjunctivae and EOM are normal. Pupils are equal, round, and reactive to light. Right eye exhibits no  discharge. Left eye exhibits no discharge. No scleral icterus.  Neck: Normal range of motion. Neck supple. No JVD present. No tracheal deviation present. No thyromegaly present.  Cardiovascular: Normal rate, regular rhythm, normal heart sounds and intact distal pulses.   No murmur heard. Pulmonary/Chest: Effort normal and breath sounds normal. No stridor. No respiratory distress. She has no  wheezes. She has no rales. She exhibits no tenderness.         Bilateral circumareolar scars inferiorly. Bilateral saline implants palpable. Skin otherwise normal. No palpable mass. Just a little bit of thickening in both periareolar scars. No axillary adenopathy.  Abdominal: Soft. Bowel sounds are normal. She exhibits no distension and no mass. There is no tenderness. There is no rebound and no guarding.  Musculoskeletal: Normal range of motion. She exhibits no edema and no tenderness.  Lymphadenopathy:    She has no cervical adenopathy.  Neurological: She is alert and oriented to person, place, and time. She has normal reflexes. Coordination normal.  Skin: Skin is warm and dry. No rash noted. She is not diaphoretic. No erythema. No pallor.  Psychiatric: She has a normal mood and affect. Her behavior is normal. Judgment and thought content normal.    Data Reviewed Mammogram reports  Assessment    New punctate calcifications left breast, 6:00 position, subareolar. Low but definite risk of noninvasive breast cancer.  History of bilateral subcutaneous saline implants    Plan    I had a long talk with the patient about the mammographic findings, physical findings, differential diagnosis and medical options. She has decided she would like to have this area excised to be certain of the diagnosis.  She'll be scheduled for left breast biopsy with needle localization in the near future. I have discussed the indications, details, techniques, and numerous risks of this procedure with her. She is aware that puncture of the saline implant is a low but definite  definite risk. She understands these issues. Her questions are answered. She agrees with this plan.       Angelia Mould. Derrell Lolling, M.D., [facility] General and Minimally invasive Surgery Breast and Colorectal Surgery Office:   [phone] Pager:   [phone]  12/12/2011, 5:56 PM

## 2011-12-12 NOTE — Patient Instructions (Signed)
Your recent mammograms show a new focal area of punctate calcifications in the left breast at the 6:00 position position, almost behind the areola. Because this is a new finding there is a low but definite chance this might represent noninvasive breast cancer.  He will be scheduled for left breast biopsy with needle localization in the near future.    Breast Biopsy WHY YOU NEED A BIOPSY Your caregiver has recommended that you have a breast tissue sample taken (biopsy). This is done to be certain that the lump or abnormality found in your breast is not cancerous (malignant). During a biopsy, a small piece of tissue is removed, so it can be examined under a microscope by a specialist (pathologist) who looks at tissues and cells and diagnoses abnormalities in them. Most lumps (tumors) or abnormalities, on or in the breast, are not cancerous (benign). However, biopsies are taken when your caregiver cannot be absolutely certain of what is wrong only from doing a physical exam, mammogram (breast X-ray), or other studies. A breast biopsy can tell you whether nothing more needs to be done, or you need more surgery or another type of treatment. A biopsy is done when there is:  Any undiagnosed breast mass.   Nipple abnormalities, dimpling, crusting, or ulcerations.   Calcium deposits (calcifications) or abnormalities seen on your mammogram, ultrasound, or MRI.   Suspicious changes in the breast (thickening, asymmetry) seen on mammogram.   Abnormal discharge from the nipple, especially blood.   Redness, swelling, and pain of the breast.  HOW A BIOPSY IS PERFORMED A biopsy is often performed on an outpatient basis (you go home the same day). This can be done in a hospital, clinic, or surgical center. Tissue samples (biopsies) are often done under local anesthesia (area is numbed). Sometimes general anesthetics are required, in which case you sleep through the procedure. Biopsies may remove the entire  lump, a small piece of the lump, or a small sliver of tissue removed by needle. TYPES OF BREAST BIOPSY  Fine needle aspiration. A thin needle is placed through the skin, to the lump or cyst, and cells are removed.   Core needle biopsy. A large needle with a special tip is placed through the skin, to the abnormality, and a piece of tissue is removed.   Stereotactic biopsy. A core needle with a special X-ray is used, to direct the needle to the lump or abnormal area, which is difficult to feel or cannot be felt.   Vacuum-assisted biopsy. A hollow probe and a gentle vacuum remove a sample of tissue.   Ultrasound guided core needle biopsy. You lie on your stomach, with your breast through an opening, and a high frequency ultrasound helps guide the needle to the area of the abnormality.   Open biopsy. An incision is made in the breast, and a piece of the lump or the whole lump is removed.  LET YOUR CAREGIVER KNOW ABOUT:  Allergies.   Medicines taken, including herbs, eye drops, over-the-counter medicines, and creams.   Use of steroids (by mouth or creams).   Previous problems with anesthetics or Novocaine.   If you are taking aspirin or blood thinners.   Possibility of pregnancy, if this applies.   History of blood clots (thrombophlebitis).   History of bleeding or blood problems.   Previous surgery.   Other health problems.  RISKS AND COMPLICATIONS   Bleeding.   Infection.   Allergy to medicines.   Bruising and swelling of the breast.  Alteration in the shape of the breast.   Not finding the lump or abnormality.   Needing more surgery.  BEFORE THE PROCEDURE  You should arrive 60 minutes prior to your procedure or as directed.   Check-in at the admissions desk, to fill out necessary forms, if you are not preregistered.   There will be consent forms to sign, prior to the procedure.   There is a waiting area for your family, while you are having your biopsy.    Try to have someone with you, to drive you home.   Do not smoke for 2 weeks before the surgery.   Let your caregiver know if you develop a cold or an infection.   Do not drink alcohol for at least 24 hours before surgery.   Wear a good support bra to the surgery.  AFTER THE PROCEDURE  After surgery, you will be taken to the recovery area, where a nurse will watch and check your progress. Once you are awake, stable, and taking fluids well, if there are no other problems, you will be allowed to go home.   Ice packs applied to your operative site may help with discomfort and keep the swelling down.   You may resume normal diet and activities as directed. Avoid strenuous activities affecting the arm on the side of the biopsy, such as tennis, swimming, heavy lifting (more than 10 pounds) or pulling.   Bruising in the breast is normal following this procedure.   Wearing a support bra, even to bed, may be more comfortable. The bra will also help keep the dressing on.   Change dressings as directed.   Your doctor may apply a pressure dressing on your breast for 24 to 48 hours.   Only take over-the-counter or prescription medicines for pain, discomfort, or fever as directed by your caregiver.   Do not take aspirin, because it can cause bleeding.  HOME CARE INSTRUCTIONS   You may resume your usual diet.   Have someone drive you home after the surgery.   Do not do any exercise, driving, lifting or general activities without your caregiver's permission.   Take medicines and over-the-counter medicines, as ordered by your caregiver.   Keep your postoperative appointments as recommended.   Do not drink alcohol while taking pain medicine.  Finding out the results of your test Not all test results are available during your visit. If your test results are not back during the visit, make an appointment with your caregiver to find out the results. Do not assume everything is normal if you  have not heard from your caregiver or the medical facility. It is important for you to follow up on all of your test results.  SEEK MEDICAL CARE IF:   You notice redness, swelling, or increasing pain in the wound.   You notice a bad smell coming from the wound or dressing.   You develop a rash.   You need stronger pain medicine.   You are having an allergic reaction or problems with your medicines.  SEEK IMMEDIATE MEDICAL CARE IF:   You have difficulty breathing.   You have a fever.   There is increased bleeding (more than a small spot) from the wound.   Pus is coming from the wound.   The wound is breaking open.  Document Released: 04/10/2005 Document Revised: 03/30/2011 Document Reviewed: 02/26/2009 [facility] Patient Information 2012 [place], [place].

## 2011-12-13 ENCOUNTER — Other Ambulatory Visit (INDEPENDENT_AMBULATORY_CARE_PROVIDER_SITE_OTHER): Payer: Self-pay | Admitting: General Surgery

## 2011-12-13 DIAGNOSIS — R928 Other abnormal and inconclusive findings on diagnostic imaging of breast: Secondary | ICD-10-CM

## 2011-12-14 ENCOUNTER — Telehealth (INDEPENDENT_AMBULATORY_CARE_PROVIDER_SITE_OTHER): Payer: Self-pay | Admitting: General Surgery

## 2011-12-14 ENCOUNTER — Encounter (HOSPITAL_BASED_OUTPATIENT_CLINIC_OR_DEPARTMENT_OTHER): Payer: Self-pay | Admitting: *Deleted

## 2011-12-14 NOTE — Progress Notes (Signed)
No labs needed-no cardiac or resp problems  

## 2011-12-14 NOTE — Telephone Encounter (Signed)
Called back patient contact number (cell) and advised call was being returned based on message left. Advised issuance would be the day of, not prior to surgery.

## 2011-12-19 NOTE — H&P (Signed)
Vanessa Mccoy     MRN: [id number]   Description: 56 year old female  Provider: Ernestene Mention, MD  Department: [facility]      Diagnoses     Abnormal mammogram   - Primary    793.80   Reason for Visit     Other    eval lt breast for calcification        Vitals -   BP Pulse Temp Resp Ht Wt    118/58 64 99 F (37.2 C) (Temporal) 16 5\' 6"  (1.676 m) 126 lb 12.8 oz (57.516 kg)       History and Physical     Ernestene Mention, MD   Patient ID: Vanessa Mccoy, female   DOB: [birth year], 56 y.o.   MRN: [id number]             HPI Vanessa Mccoy is a 56 y.o. female.  She is referred by Dr. Dorita Fray at the Breast center of [facility] for evaluation of an abnormal mammogram of the left breast. Dr. Evelena Peat is her primary care physician.   The patient has minimal prior breast history. She states she had a right breast cyst aspirated many years ago. On 02/23/2011 she had bilateral breast augmentation by Pleas Patricia, and she states that she has subcutaneous saline implants.   She had bilateral mammograms at [facility] in June was told there was an abnormality of the left. She had a left mammogram at the Breast center of [facility] in July and on August 5 she was brought back to the [facility]  where they described a focal area of punctate calcifications at the 6:00 position, subareolar on the left. Image guided biopsy was planned but the tissue was too thin for this to be performed. She was referred here for consideration of excisional biopsy.   Family history is negative for breast or ovarian cancer. The patient is otherwise fairly healthy.     Past Medical History   Diagnosis  Date   .  DEPRESSION  11/13/2008   .  Lumbago  11/13/2008   .  ROTATOR CUFF SYNDROME  03/23/2010       Past Surgical History   Procedure  Date   .  Breast surgery         augmentation       Family History   Problem  Relation  Age of Onset   .  Heart disease  Mother       .  Heart disease  Brother     .  Cancer  Maternal Grandmother         cervical & lung   .  Diabetes  Maternal Grandfather     .  Stroke  Maternal Grandfather        Social History History   Substance Use Topics   .  Smoking status:  Never Smoker    .  Smokeless tobacco:  Never Used   .  Alcohol Use:  0.0 oz/week         1-2 glasses of wine/ week       Allergies   Allergen  Reactions   .  Codeine Sulfate         REACTION: GI upset       Current Outpatient Prescriptions   Medication  Sig  Dispense  Refill   .  FLUoxetine (PROZAC) 10 MG tablet  TAKE 1 TABLET BY MOUTH EVERY DAY   30 tablet  6      Review of Systems  Constitutional: Negative for fever, chills and unexpected weight change.  HENT: Negative for hearing loss, congestion, sore throat, trouble swallowing and voice change.   Eyes: Negative for visual disturbance.  Respiratory: Negative for cough and wheezing.   Cardiovascular: Negative for chest pain, palpitations and leg swelling.  Gastrointestinal: Negative for nausea, vomiting, abdominal pain, diarrhea, constipation, blood in stool, abdominal distention and anal bleeding.  Genitourinary: Negative for hematuria, vaginal bleeding and difficulty urinating.  Musculoskeletal: Negative for arthralgias.  Skin: Negative for rash and wound.  Neurological: Negative for seizures, syncope and headaches.  Hematological: Negative for adenopathy. Does not bruise/bleed easily.  Psychiatric/Behavioral: Negative for confusion.    Blood pressure 118/58, pulse 64, temperature 99 F (37.2 C), temperature source Temporal, resp. rate 16, height 5\' 6"  (1.676 m), weight 126 lb 12.8 oz (57.516 kg).   Physical Exam   Constitutional: She is oriented to person, place, and time. She appears well-developed and well-nourished. No distress.  HENT:   Head: Normocephalic.   Nose: Nose normal.   Mouth/Throat: No oropharyngeal exudate.  Eyes: Conjunctivae and EOM are normal. Pupils are  equal, round, and reactive to light. Right eye exhibits no discharge. Left eye exhibits no discharge. No scleral icterus.  Neck: Normal range of motion. Neck supple. No JVD present. No tracheal deviation present. No thyromegaly present.  Cardiovascular: Normal rate, regular rhythm, normal heart sounds and intact distal pulses.    No murmur heard. Pulmonary/Chest: Effort normal and breath sounds normal. No stridor. No respiratory distress. She has no wheezes. She has no rales. She exhibits no tenderness.      Bilateral circumareolar scars inferiorly. Bilateral saline implants palpable. Skin otherwise normal. No palpable mass. Just a little bit of thickening in both periareolar scars. No axillary adenopathy.  Abdominal: Soft. Bowel sounds are normal. She exhibits no distension and no mass. There is no tenderness. There is no rebound and no guarding.  Musculoskeletal: Normal range of motion. She exhibits no edema and no tenderness.  Lymphadenopathy:    She has no cervical adenopathy.  Neurological: She is alert and oriented to person, place, and time. She has normal reflexes. Coordination normal.  Skin: Skin is warm and dry. No rash noted. She is not diaphoretic. No erythema. No pallor.  Psychiatric: She has a normal mood and affect. Her behavior is normal. Judgment and thought content normal.    Data Reviewed Mammogram reports   Assessment New punctate calcifications left breast, 6:00 position, subareolar. Low but definite risk of noninvasive breast cancer.   History of bilateral subcutaneous saline implants   Plan I had a long talk with the patient about the mammographic findings, physical findings, differential diagnosis and medical options. She has decided she would like to have this area excised to be certain of the diagnosis.   She'll be scheduled for left breast biopsy with needle localization in the near future. I have discussed the indications, details, techniques, and numerous  risks of this procedure with her. She is aware that puncture of the saline implant is a low but definite  definite risk. She understands these issues. Her questions are answered. She agrees with this plan.       Angelia Mould. Derrell Lolling, M.D., [facility] General and Minimally invasive Surgery Breast and Colorectal Surgery Office:   [phone] Pager:   [phone]

## 2011-12-20 ENCOUNTER — Encounter (HOSPITAL_BASED_OUTPATIENT_CLINIC_OR_DEPARTMENT_OTHER): Payer: Self-pay | Admitting: Anesthesiology

## 2011-12-20 ENCOUNTER — Ambulatory Visit
Admission: RE | Admit: 2011-12-20 | Discharge: 2011-12-20 | Disposition: A | Discharge status: Home / Self Care | Payer: BC Managed Care – PPO | Source: Ambulatory Visit | Attending: General Surgery | Admitting: General Surgery

## 2011-12-20 ENCOUNTER — Encounter (HOSPITAL_BASED_OUTPATIENT_CLINIC_OR_DEPARTMENT_OTHER)
Admission: RE | Disposition: A | Discharge status: Home / Self Care | Payer: Self-pay | Source: Ambulatory Visit | Attending: General Surgery

## 2011-12-20 ENCOUNTER — Encounter (HOSPITAL_BASED_OUTPATIENT_CLINIC_OR_DEPARTMENT_OTHER): Payer: Self-pay | Admitting: *Deleted

## 2011-12-20 ENCOUNTER — Ambulatory Visit (HOSPITAL_BASED_OUTPATIENT_CLINIC_OR_DEPARTMENT_OTHER): Payer: BC Managed Care – PPO | Admitting: Anesthesiology

## 2011-12-20 ENCOUNTER — Ambulatory Visit (HOSPITAL_BASED_OUTPATIENT_CLINIC_OR_DEPARTMENT_OTHER)
Admission: RE | Admit: 2011-12-20 | Discharge: 2011-12-20 | Disposition: A | Discharge status: Home / Self Care | Payer: BC Managed Care – PPO | Source: Ambulatory Visit | Attending: General Surgery | Admitting: General Surgery

## 2011-12-20 ENCOUNTER — Telehealth (INDEPENDENT_AMBULATORY_CARE_PROVIDER_SITE_OTHER): Payer: Self-pay

## 2011-12-20 DIAGNOSIS — R928 Other abnormal and inconclusive findings on diagnostic imaging of breast: Secondary | ICD-10-CM

## 2011-12-20 DIAGNOSIS — N6039 Fibrosclerosis of unspecified breast: Secondary | ICD-10-CM | POA: Insufficient documentation

## 2011-12-20 DIAGNOSIS — N6019 Diffuse cystic mastopathy of unspecified breast: Secondary | ICD-10-CM

## 2011-12-20 HISTORY — DX: Other specified postprocedural states: Z98.890

## 2011-12-20 HISTORY — PX: BREAST BIOPSY: SHX20

## 2011-12-20 HISTORY — DX: Other specified health status: Z78.9

## 2011-12-20 HISTORY — DX: Nausea with vomiting, unspecified: R11.2

## 2011-12-20 LAB — POCT HEMOGLOBIN-HEMACUE: Hemoglobin: 13.2 g/dL (ref 12.0–15.0)

## 2011-12-20 SURGERY — BREAST BIOPSY WITH NEEDLE LOCALIZATION
Anesthesia: General | Site: Breast | Laterality: Left | Wound class: Clean

## 2011-12-20 MED ORDER — METOCLOPRAMIDE HCL 5 MG/ML IJ SOLN: 10.0000 mg | Freq: Once | INTRAMUSCULAR | Status: DC | PRN

## 2011-12-20 MED ORDER — CHLORHEXIDINE GLUCONATE 4 % EX LIQD: 1.0000 "application " | Freq: Once | CUTANEOUS | Status: DC

## 2011-12-20 MED ORDER — HYDROCODONE-ACETAMINOPHEN 5-325 MG PO TABS: 1.0000 | ORAL_TABLET | ORAL | Status: AC | PRN

## 2011-12-20 MED ORDER — SODIUM CHLORIDE 0.9 % IJ SOLN: 3.0000 mL | Freq: Two times a day (BID) | INTRAMUSCULAR | Status: DC

## 2011-12-20 MED ORDER — BUPIVACAINE-EPINEPHRINE 0.5% -1:200000 IJ SOLN
INTRAMUSCULAR | Status: DC | PRN
  Administered 2011-12-20: 3.5 mL

## 2011-12-20 MED ORDER — OXYCODONE HCL 5 MG PO TABS: 5.0000 mg | ORAL_TABLET | Freq: Once | ORAL | Status: DC | PRN

## 2011-12-20 MED ORDER — 0.9 % SODIUM CHLORIDE (POUR BTL) OPTIME
TOPICAL | Status: DC | PRN
  Administered 2011-12-20: 1000 mL

## 2011-12-20 MED ORDER — HYDROMORPHONE HCL PF 1 MG/ML IJ SOLN: 0.2500 mg | INTRAMUSCULAR | Status: DC | PRN

## 2011-12-20 MED ORDER — ONDANSETRON HCL 4 MG/2ML IJ SOLN: 4.0000 mg | Freq: Four times a day (QID) | INTRAMUSCULAR | Status: DC | PRN

## 2011-12-20 MED ORDER — SODIUM CHLORIDE 0.9 % IV SOLN: INTRAVENOUS | Status: DC

## 2011-12-20 MED ORDER — ACETAMINOPHEN 325 MG PO TABS: 650.0000 mg | ORAL_TABLET | ORAL | Status: DC | PRN

## 2011-12-20 MED ORDER — ONDANSETRON HCL 4 MG/2ML IJ SOLN
INTRAMUSCULAR | Status: DC | PRN
  Administered 2011-12-20: 4 mg via INTRAVENOUS

## 2011-12-20 MED ORDER — LACTATED RINGERS IV SOLN
INTRAVENOUS | Status: DC
  Administered 2011-12-20: 13:00:00 via INTRAVENOUS

## 2011-12-20 MED ORDER — MORPHINE SULFATE 2 MG/ML IJ SOLN: 2.0000 mg | INTRAMUSCULAR | Status: DC | PRN

## 2011-12-20 MED ORDER — SCOPOLAMINE 1 MG/3DAYS TD PT72
1.0000 | MEDICATED_PATCH | Freq: Once | TRANSDERMAL | Status: DC
  Administered 2011-12-20: 1.5 mg via TRANSDERMAL

## 2011-12-20 MED ORDER — MIDAZOLAM HCL 5 MG/5ML IJ SOLN
INTRAMUSCULAR | Status: DC | PRN
  Administered 2011-12-20: 2 mg via INTRAVENOUS

## 2011-12-20 MED ORDER — CEFAZOLIN SODIUM-DEXTROSE 2-3 GM-% IV SOLR
2.0000 g | INTRAVENOUS | Status: AC
  Administered 2011-12-20: 2 g via INTRAVENOUS

## 2011-12-20 MED ORDER — OXYCODONE HCL 5 MG/5ML PO SOLN: 5.0000 mg | Freq: Once | ORAL | Status: DC | PRN

## 2011-12-20 MED ORDER — ACETAMINOPHEN 10 MG/ML IV SOLN
1000.0000 mg | Freq: Once | INTRAVENOUS | Status: AC
  Administered 2011-12-20: 1000 mg via INTRAVENOUS

## 2011-12-20 MED ORDER — SODIUM CHLORIDE 0.9 % IJ SOLN: 3.0000 mL | INTRAMUSCULAR | Status: DC | PRN

## 2011-12-20 MED ORDER — OXYCODONE HCL 5 MG PO TABS: 5.0000 mg | ORAL_TABLET | ORAL | Status: DC | PRN

## 2011-12-20 MED ORDER — SODIUM CHLORIDE 0.9 % IV SOLN: 250.0000 mL | INTRAVENOUS | Status: DC | PRN

## 2011-12-20 MED ORDER — FENTANYL CITRATE 0.05 MG/ML IJ SOLN
INTRAMUSCULAR | Status: DC | PRN
  Administered 2011-12-20: 50 ug via INTRAVENOUS

## 2011-12-20 MED ORDER — ACETAMINOPHEN 650 MG RE SUPP: 650.0000 mg | RECTAL | Status: DC | PRN

## 2011-12-20 MED ORDER — DEXAMETHASONE SODIUM PHOSPHATE 4 MG/ML IJ SOLN
INTRAMUSCULAR | Status: DC | PRN
Start: 2011-12-20 — End: 2011-12-20
  Administered 2011-12-20: 10 mg via INTRAVENOUS

## 2011-12-20 SURGICAL SUPPLY — 60 items
ADH SKN CLS APL DERMABOND .7 (GAUZE/BANDAGES/DRESSINGS) ×1
APL SKNCLS STERI-STRIP NONHPOA (GAUZE/BANDAGES/DRESSINGS)
APPLIER CLIP 9.375 MED OPEN (MISCELLANEOUS)
APR CLP MED 9.3 20 MLT OPN (MISCELLANEOUS)
BANDAGE ELASTIC 6 VELCRO ST LF (GAUZE/BANDAGES/DRESSINGS) IMPLANT
BENZOIN TINCTURE PRP APPL 2/3 (GAUZE/BANDAGES/DRESSINGS) IMPLANT
BLADE HEX COATED 2.75 (ELECTRODE) ×2 IMPLANT
BLADE SURG 15 STRL LF DISP TIS (BLADE) ×2 IMPLANT
BLADE SURG 15 STRL SS (BLADE) ×2
CANISTER SUCTION 1200CC (MISCELLANEOUS) ×2 IMPLANT
CHLORAPREP W/TINT 26ML (MISCELLANEOUS) ×2 IMPLANT
CLIP APPLIE 9.375 MED OPEN (MISCELLANEOUS) IMPLANT
CLOTH BEACON ORANGE TIMEOUT ST (SAFETY) ×2 IMPLANT
COVER MAYO STAND STRL (DRAPES) ×2 IMPLANT
COVER TABLE BACK 60X90 (DRAPES) ×2 IMPLANT
DECANTER SPIKE VIAL GLASS SM (MISCELLANEOUS) ×1 IMPLANT
DERMABOND ADVANCED (GAUZE/BANDAGES/DRESSINGS) ×1
DERMABOND ADVANCED .7 DNX12 (GAUZE/BANDAGES/DRESSINGS) IMPLANT
DEVICE DUBIN W/COMP PLATE 8390 (MISCELLANEOUS) ×1 IMPLANT
DRAPE LAPAROSCOPIC ABDOMINAL (DRAPES) IMPLANT
DRAPE LAPAROTOMY TRNSV 102X78 (DRAPE) IMPLANT
DRAPE PED LAPAROTOMY (DRAPES) ×2 IMPLANT
DRAPE UTILITY XL STRL (DRAPES) ×2 IMPLANT
ELECT REM PT RETURN 9FT ADLT (ELECTROSURGICAL) ×2
ELECTRODE REM PT RTRN 9FT ADLT (ELECTROSURGICAL) ×1 IMPLANT
GAUZE SPONGE 4X4 12PLY STRL LF (GAUZE/BANDAGES/DRESSINGS) IMPLANT
GAUZE SPONGE 4X4 16PLY XRAY LF (GAUZE/BANDAGES/DRESSINGS) IMPLANT
GLOVE EUDERMIC 7 POWDERFREE (GLOVE) ×2 IMPLANT
GLOVE SKINSENSE NS SZ6.5 (GLOVE) ×1
GLOVE SKINSENSE STRL SZ6.5 (GLOVE) IMPLANT
GOWN PREVENTION PLUS XLARGE (GOWN DISPOSABLE) ×2 IMPLANT
GOWN PREVENTION PLUS XXLARGE (GOWN DISPOSABLE) ×2 IMPLANT
KIT MARKER MARGIN INK (KITS) IMPLANT
NDL HYPO 25X1 1.5 SAFETY (NEEDLE) ×1 IMPLANT
NEEDLE HYPO 22GX1.5 SAFETY (NEEDLE) IMPLANT
NEEDLE HYPO 25X1 1.5 SAFETY (NEEDLE) ×2 IMPLANT
NS IRRIG 1000ML POUR BTL (IV SOLUTION) ×2 IMPLANT
PACK BASIN DAY SURGERY FS (CUSTOM PROCEDURE TRAY) ×2 IMPLANT
PENCIL BUTTON HOLSTER BLD 10FT (ELECTRODE) ×2 IMPLANT
SLEEVE SCD COMPRESS KNEE MED (MISCELLANEOUS) ×1 IMPLANT
SPONGE LAP 4X18 X RAY DECT (DISPOSABLE) ×2 IMPLANT
STAPLER VISISTAT 35W (STAPLE) IMPLANT
STRIP CLOSURE SKIN 1/2X4 (GAUZE/BANDAGES/DRESSINGS) IMPLANT
SUT ETHILON 4 0 PS 2 18 (SUTURE) IMPLANT
SUT MON AB 4-0 PC3 18 (SUTURE) ×1 IMPLANT
SUT SILK 2 0 SH (SUTURE) ×2 IMPLANT
SUT VIC AB 2-0 SH 27 (SUTURE)
SUT VIC AB 2-0 SH 27XBRD (SUTURE) IMPLANT
SUT VIC AB 3-0 FS2 27 (SUTURE) IMPLANT
SUT VIC AB 4-0 P-3 18XBRD (SUTURE) IMPLANT
SUT VIC AB 4-0 P3 18 (SUTURE)
SUT VICRYL 3-0 CR8 SH (SUTURE) ×2 IMPLANT
SUT VICRYL 4-0 PS2 18IN ABS (SUTURE) IMPLANT
SYR BULB 3OZ (MISCELLANEOUS) IMPLANT
SYR CONTROL 10ML LL (SYRINGE) ×2 IMPLANT
TAPE HYPAFIX 4 X10 (GAUZE/BANDAGES/DRESSINGS) IMPLANT
TOWEL OR NON WOVEN STRL DISP B (DISPOSABLE) ×2 IMPLANT
TUBE CONNECTING 20X1/4 (TUBING) ×2 IMPLANT
WATER STERILE IRR 1000ML POUR (IV SOLUTION) ×1 IMPLANT
YANKAUER SUCT BULB TIP NO VENT (SUCTIONS) ×2 IMPLANT

## 2011-12-20 NOTE — Op Note (Signed)
Patient Name:           Vanessa Mccoy   Date of Surgery:        12/20/2011  Pre op Diagnosis:      Abnormal mammogram left breast, new punctate calcifications 6:00 position, subareolar  Post op Diagnosis:    same  Procedure:                 Excisional biopsy left breast calcifications with needle localization and specimen mammogram  Surgeon:                     Angelia Mould. Derrell Lolling, M.D., FACS  Assistant:                      none  Operative Indications:   Vanessa Mccoy is a 56 y.o. female. She was referred by Dr. Dorita Fray at the Breast center of [facility] for evaluation of an abnormal mammogram of the left breast. Dr. Evelena Peat is her primary care physician.  The patient has minimal prior breast history. She states she had a right breast cyst aspirated many years ago. On 02/23/2011 she had bilateral breast augmentation by Pleas Patricia, and she states that she has subcutaneous saline implants.  She had bilateral mammograms at [facility] in June was told there was an abnormality of the left. She had a left mammogram at the Breast center of [facility] in July and on August 5 she was brought back to the [facility] where they described a focal area of punctate calcifications at the 6:00 position, subareolar on the left. Image guided biopsy was planned but the tissue was too thin for this to be performed. She was referred here for consideration of excisional biopsy. There are scars in the infra-arealar area, but no palpable mass.   Operative Findings:       The breast tissue was very thin in the subareolar area. The wire was placed tangentially near the area of calcifications and the specimen showed the calcifications to be present in the medial aspect. Dissection of this area required entering the capsule around the implant. The implant did not appear to be injured.  Procedure in Detail:          The patient underwent placement of a localizing wire from the medial to lateral location  by Dr. Deboraha Sprang at the breast center of [facility]. The patient was brought to the operating room at [facility]. General anesthesia was induced. Intravenous antibiotics were given. Surgical time out was performed. The left breast was prepped and draped in a sterile fashion. 0.5% Marcaine with epinephrine was used as local infiltration anesthetic, being quite careful to make sure that this was infiltrated only into the very superficial tissues.  I made a incision at the areolar margin inferiorly from about the 8:30 position to the 3:30 position. Dissection was carried down through the dermis and then with lifting the skin and dermis up I dissected carefully around the wire being careful to take as much of the tissue medially as possible. I removed the entire wire in the specimen. The dissection was carried all the way down to the capsule of the implant using electrocautery. No scissors or knives were used. The specimen was removed and marked with silk suture to mark the superior and the lateral margins. Specimen mammogram was performed. Dr. Deboraha Sprang stated that the calcifications were within the specimen. The wound was irrigated with saline. The subcutaneous tissue and  the capsule around the implant were closed with multiple interrupted sutures of 3-0 Vicryl and the skin closed with a running subcuticular suture of 4-0 Monocryl and Dermabond. The patient taken to recovery was stable condition. EBL 5 cc. Counts correct. Complications none.     Angelia Mould. Derrell Lolling, M.D., FACS General and Minimally Invasive Surgery Breast and Colorectal Surgery  12/20/2011 1:54 PM

## 2011-12-20 NOTE — Interval H&P Note (Signed)
History and Physical Interval Note:  12/20/2011 1:06 PM  Vanessa Mccoy  has presented today for surgery, with the diagnosis of abnormal mammogram Left breast  The goals and the  various methods of treatment have been discussed with the patient and family. After consideration of risks, benefits and other options for treatment, the patient has consented to  Procedure(s) (LRB): BREAST BIOPSY WITH NEEDLE LOCALIZATION (Left) as a surgical intervention .  The patient's history has been reviewed, patient examined, no change in status, stable for surgery.  I have reviewed the patient's chart and labs.  Questions were answered to the patient's satisfaction.     Ernestene Mention

## 2011-12-20 NOTE — Telephone Encounter (Signed)
LMOM giving the pt her f/u appt with Dr Derrell Lolling on 9/10 arrive at 4:15 for 4:30.

## 2011-12-20 NOTE — Transfer of Care (Signed)
Immediate Anesthesia Transfer of Care Note  Patient: Vanessa Mccoy  Procedure(s) Performed: Procedure(s) (LRB): BREAST BIOPSY WITH NEEDLE LOCALIZATION (Left)  Patient Location: PACU  Anesthesia Type: General  Level of Consciousness: awake, alert  and oriented  Airway & Oxygen Therapy: Patient Spontanous Breathing and Patient connected to face mask oxygen  Post-op Assessment: Report given to PACU RN and Post -op Vital signs reviewed and stable  Post vital signs: Reviewed and stable  Complications: No apparent anesthesia complications

## 2011-12-20 NOTE — Anesthesia Preprocedure Evaluation (Signed)
Anesthesia Evaluation  Patient identified by MRN, date of birth, ID band Patient awake    Reviewed: Allergy & Precautions, H&P , NPO status , Patient's Chart, lab work & pertinent test results, reviewed documented beta blocker date and time   History of Anesthesia Complications (+) PONV  Airway Mallampati: II TM Distance: >3 FB Neck ROM: full    Dental   Pulmonary neg pulmonary ROS,  breath sounds clear to auscultation        Cardiovascular negative cardio ROS  Rhythm:regular     Neuro/Psych PSYCHIATRIC DISORDERS negative neurological ROS     GI/Hepatic negative GI ROS, Neg liver ROS,   Endo/Other  negative endocrine ROS  Renal/GU negative Renal ROS  negative genitourinary   Musculoskeletal   Abdominal   Peds  Hematology negative hematology ROS (+)   Anesthesia Other Findings See surgeon's H&P   Reproductive/Obstetrics negative OB ROS                           Anesthesia Physical Anesthesia Plan  ASA: II  Anesthesia Plan: General   Post-op Pain Management:    Induction: Intravenous  Airway Management Planned: LMA  Additional Equipment:   Intra-op Plan:   Post-operative Plan: Extubation in OR  Informed Consent: I have reviewed the patients History and Physical, chart, labs and discussed the procedure including the risks, benefits and alternatives for the proposed anesthesia with the patient or authorized representative who has indicated his/her understanding and acceptance.   Dental Advisory Given  Plan Discussed with: CRNA and Surgeon  Anesthesia Plan Comments:         Anesthesia Quick Evaluation

## 2011-12-20 NOTE — Anesthesia Postprocedure Evaluation (Signed)
Anesthesia Post Note  Patient: Vanessa Mccoy  Procedure(s) Performed: Procedure(s) (LRB): BREAST BIOPSY WITH NEEDLE LOCALIZATION (Left)  Anesthesia type: General  Patient location: PACU  Post pain: Pain level controlled  Post assessment: Patient's Cardiovascular Status Stable  Last Vitals:  Filed Vitals:   12/20/11 1500  BP: 128/70  Pulse: 60  Temp: 36.4 C  Resp: 16    Post vital signs: Reviewed and stable  Level of consciousness: alert  Complications: No apparent anesthesia complications

## 2011-12-20 NOTE — Anesthesia Procedure Notes (Signed)
Procedure Name: LMA Insertion Date/Time: 12/20/2011 1:18 PM Performed by: Burna Cash Pre-anesthesia Checklist: Patient identified, Emergency Drugs available, Suction available and Patient being monitored Patient Re-evaluated:Patient Re-evaluated prior to inductionOxygen Delivery Method: Circle System Utilized Preoxygenation: Pre-oxygenation with 100% oxygen Intubation Type: IV induction Ventilation: Mask ventilation without difficulty LMA: LMA inserted LMA Size: 4.0 Number of attempts: 1 Airway Equipment and Method: bite block Placement Confirmation: positive ETCO2 Tube secured with: Tape Dental Injury: Teeth and Oropharynx as per pre-operative assessment

## 2011-12-21 ENCOUNTER — Encounter (HOSPITAL_BASED_OUTPATIENT_CLINIC_OR_DEPARTMENT_OTHER): Payer: Self-pay | Admitting: General Surgery

## 2011-12-22 ENCOUNTER — Telehealth (INDEPENDENT_AMBULATORY_CARE_PROVIDER_SITE_OTHER): Payer: Self-pay

## 2011-12-22 NOTE — Progress Notes (Signed)
Quick Note:  Inform patient of Pathology report,. ______ 

## 2011-12-22 NOTE — Telephone Encounter (Signed)
Pt called for her pathology results and was made aware.

## 2012-01-02 ENCOUNTER — Encounter (INDEPENDENT_AMBULATORY_CARE_PROVIDER_SITE_OTHER): Payer: BC Managed Care – PPO | Admitting: General Surgery

## 2012-01-12 ENCOUNTER — Ambulatory Visit (INDEPENDENT_AMBULATORY_CARE_PROVIDER_SITE_OTHER): Payer: BC Managed Care – PPO | Admitting: General Surgery

## 2012-01-12 ENCOUNTER — Encounter (INDEPENDENT_AMBULATORY_CARE_PROVIDER_SITE_OTHER): Payer: Self-pay | Admitting: General Surgery

## 2012-01-12 VITALS — BP 118/78 | HR 72 | Temp 99.0°F | Resp 18 | Ht 66.0 in | Wt 127.0 lb

## 2012-01-12 DIAGNOSIS — R928 Other abnormal and inconclusive findings on diagnostic imaging of breast: Secondary | ICD-10-CM

## 2012-01-12 NOTE — Patient Instructions (Signed)
The final pathology of your left breast biopsy shows benign fibrocystic disease. There is no evidence of atypical cells or malignancy. Your incision is healing normally without signs of any surgical complication.  You are advised to get an annual breast exam and annual mammography from here on out.  Return to see Dr. Derrell Lolling if further problems arise.

## 2012-01-12 NOTE — Progress Notes (Signed)
Patient ID: Vanessa Mccoy, female   DOB: [birth year], 56 y.o.   MRN: [id number] History: This patient underwent left partial mastectomy with needle localization for focal microcalcifications on 12/17/2010. This had to be done carefully because of a subcutaneous implant in place. The specimen showed the wire, and the calcifications within the specimen. Final pathology report showed benign fibrocystic changes. She has no complaints about wound healing except she feels a suture present at the edge of the wound.  Exam: Patient looks well. No distress Left breast incision is healing normally. No infection. No drainage. No hematoma. Small Monocryl suture at the edge of incision was cut out and small Band-Aid placed  Assessment: Microcalcifications left breast, proven to be benign fibrocystic change on excisional biopsy  Plan: Usual wound care Annual breast exam Annual mammography Return to see me when necessary.   Angelia Mould. Derrell Lolling, M.D., [facility] General and Minimally invasive Surgery Breast and Colorectal Surgery Office:   [phone] Pager:   [phone]

## 2012-05-13 ENCOUNTER — Other Ambulatory Visit (INDEPENDENT_AMBULATORY_CARE_PROVIDER_SITE_OTHER): Payer: BC Managed Care – PPO

## 2012-05-13 DIAGNOSIS — Z0000 Encounter for general adult medical examination without abnormal findings: Secondary | ICD-10-CM

## 2012-05-13 LAB — HEPATIC FUNCTION PANEL
ALT: 16 U/L (ref 0–35)
AST: 26 U/L (ref 0–37)
Albumin: 4.6 g/dL (ref 3.5–5.2)
Total Bilirubin: 0.7 mg/dL (ref 0.3–1.2)
Total Protein: 7.8 g/dL (ref 6.0–8.3)

## 2012-05-13 LAB — POCT URINALYSIS DIPSTICK
Blood, UA: NEGATIVE
Nitrite, UA: NEGATIVE
Protein, UA: NEGATIVE
Urobilinogen, UA: 0.2
pH, UA: 6

## 2012-05-13 LAB — BASIC METABOLIC PANEL
CO2: 26 mEq/L (ref 19–32)
Calcium: 9.4 mg/dL (ref 8.4–10.5)
Creatinine, Ser: 0.9 mg/dL (ref 0.4–1.2)
GFR: 72.47 mL/min (ref >60.00)
Glucose, Bld: 92 mg/dL (ref 70–99)

## 2012-05-13 LAB — CBC WITH DIFFERENTIAL/PLATELET
Basophils Relative: 1.5 % (ref 0.0–3.0)
Eosinophils Relative: 3.9 % (ref 0.0–5.0)
Lymphs Abs: 1.8 10*3/uL (ref 0.7–4.0)
Monocytes Absolute: 0.4 10*3/uL (ref 0.1–1.0)
Monocytes Relative: 8.3 % (ref 3.0–12.0)
Neutro Abs: 2.6 10*3/uL (ref 1.4–7.7)
Platelets: 235 10*3/uL (ref 150.0–400.0)
RBC: 4.36 Mil/uL (ref 3.87–5.11)

## 2012-05-13 LAB — TSH: TSH: 0.92 u[IU]/mL (ref 0.35–5.50)

## 2012-05-13 LAB — LIPID PANEL
HDL: 87.7 mg/dL (ref >39.00)
Total CHOL/HDL Ratio: 2

## 2012-05-20 ENCOUNTER — Ambulatory Visit (INDEPENDENT_AMBULATORY_CARE_PROVIDER_SITE_OTHER): Payer: BC Managed Care – PPO | Admitting: Family Medicine

## 2012-05-20 ENCOUNTER — Encounter: Payer: Self-pay | Admitting: Family Medicine

## 2012-05-20 VITALS — BP 120/80 | HR 72 | Temp 97.8°F | Resp 12

## 2012-05-20 DIAGNOSIS — Z0000 Encounter for general adult medical examination without abnormal findings: Secondary | ICD-10-CM

## 2012-05-20 MED ORDER — FLUOXETINE HCL 10 MG PO TABS: 10.0000 mg | ORAL_TABLET | Freq: Every day | ORAL | Status: DC

## 2012-05-20 NOTE — Progress Notes (Signed)
Subjective:    Patient ID: Vanessa Mccoy, female    DOB: 02/26/1956, 57 y.o.   MRN: [id number]  HPI  Patient seen for well visit. She continues to see gynecologist. Generally very healthy.  Takes no regular medications. Her mom did pass away suddenly from what sounds like hemorrhagic stroke back in December. She's had some difficulties adapting to that. Has some anxiety and slight decreased appetite. She requests going back on fluoxetine which she's taken previously.  She has not taken any consistent calcium or vitamin D. She does exercise with walking frequently. Had colonoscopy age 32. Tetanus is up-to-date.  Past Medical History  Diagnosis Date  . DEPRESSION 11/13/2008  . Lumbago 11/13/2008  . ROTATOR CUFF SYNDROME 03/23/2010  . No pertinent past medical history   . PONV (postoperative nausea and vomiting)    Past Surgical History  Procedure Date  . Breast surgery 11/12    augmentation-bilat with saline implants  . Breast biopsy     many yrs ago-rt br cyst aspirated  . Appendectomy 1977    exp lap  . Breast biopsy 12/20/2011    Procedure: BREAST BIOPSY WITH NEEDLE LOCALIZATION;  Surgeon: Ernestene Mention, MD;  Location: [facility];  Service: General;  Laterality: Left;  Left breast biospy with Needle localization    reports that she has never smoked. She has never used smokeless tobacco. She reports that she drinks alcohol. She reports that she does not use illicit drugs. family history includes Cancer in her maternal grandmother; Diabetes in her maternal grandfather; Heart disease in her brother and mother; Stroke in her maternal grandfather; and Stroke (age of onset:80) in her mother. Allergies  Allergen Reactions  . Codeine Sulfate     REACTION: GI upset  . Hydrocodone Nausea And Vomiting     Review of Systems  Constitutional: Negative for fever, activity change, appetite change and fatigue.  HENT: Negative for hearing loss, ear pain, sore throat and trouble  swallowing.   Eyes: Negative for visual disturbance.  Respiratory: Negative for cough and shortness of breath.   Cardiovascular: Negative for chest pain and palpitations.  Gastrointestinal: Negative for abdominal pain, diarrhea, constipation and blood in stool.  Genitourinary: Negative for dysuria and hematuria.  Musculoskeletal: Negative for myalgias, back pain and arthralgias.  Skin: Negative for rash.  Neurological: Negative for dizziness, syncope and headaches.  Hematological: Negative for adenopathy.  Psychiatric/Behavioral: Positive for dysphoric mood. Negative for confusion.       Objective:   Physical Exam  Constitutional: She is oriented to person, place, and time. She appears well-developed and well-nourished.  HENT:  Head: Normocephalic and atraumatic.  Eyes: EOM are normal. Pupils are equal, round, and reactive to light.  Neck: Normal range of motion. Neck supple. No thyromegaly present.  Cardiovascular: Normal rate, regular rhythm and normal heart sounds.   No murmur heard. Pulmonary/Chest: Breath sounds normal. No respiratory distress. She has no wheezes. She has no rales.  Abdominal: Soft. Bowel sounds are normal. She exhibits no distension and no mass. There is no tenderness. There is no rebound and no guarding.  Musculoskeletal: Normal range of motion. She exhibits no edema.  Lymphadenopathy:    She has no cervical adenopathy.  Neurological: She is alert and oriented to person, place, and time. She displays normal reflexes. No cranial nerve deficit.  Skin: No rash noted.  Psychiatric: She has a normal mood and affect. Her behavior is normal. Judgment and thought content normal.  Assessment & Plan:  Healthy 57 year old female. Recommend consistent calcium and vitamin D consumption.  Continue weight bearing exercise. She will discuss possible repeat DEXA scan with her GYN. Renewed fluoxetine 10 mg daily

## 2012-05-20 NOTE — Patient Instructions (Addendum)
Calcium 1,200 mg daily and Vit D 800 IU daily.

## 2012-08-19 ENCOUNTER — Other Ambulatory Visit: Payer: Self-pay | Admitting: Obstetrics and Gynecology

## 2012-09-06 ENCOUNTER — Telehealth: Payer: Self-pay | Admitting: Family Medicine

## 2012-09-06 NOTE — Telephone Encounter (Signed)
Pt had CPE 05-20-12 and fluoxetine 10 mg was re-started.  Do you want an OV?

## 2012-09-06 NOTE — Telephone Encounter (Signed)
Patient Information:  Caller Name: Lovey  Phone: [phone]  Patient: Vanessa Mccoy, Vanessa Mccoy  Gender: Female  DOB: 08-04-1955  Age: 57 Years  PCP: Evelena Peat [facility])  Office Follow Up:  Does the office need to follow up with this patient?: Yes  Instructions For The Office: Patient requests a Rx for anxiety medication, for use as needed, to be called into [facility] in [place] at [phone]. Patient can be reached at [phone].  RN Note:  Patient states she has history of anxiety. Patient states she had recent loss of her Mother in 03/2012. Patient states she has had disruption in sleep pattern and low motivation. Patient states she has had a 10 pound weight loss since the loss of her mother. Patient states she is prescribed Fluoxetine 10mg . daily. Patient is inquiring if she can be prescribed an anti anxiety medication for use as needed. Patient denies thoughts of harming self or others. Patient states she has a good support system. Triage per Anxiety and Depression Protocol. No emergent sx identified. Disposition of " Call Provider within 24 hours" obtained related to positive triage assessment for " New or increasing symptoms and taking medications as prescribed." Care advice given per guidelines. Patient advised to eat regular meals, patient advised to surrond herself with supportive people. Patient advised to listen to relaxing music, regular walks/exercise. Call back parameters reviewed. Patient verbalizes understanding. Patient requests a Rx for anxiety medication, for use as needed, to be [facility] into [facility] in [place] at [phone]. Patient can be reached at [phone].  Symptoms  Reason For Call & Symptoms: Period Anxiety  Reviewed Health History In EMR: Yes  Reviewed Medications In EMR: Yes  Reviewed Allergies In EMR: Yes  Reviewed Surgeries / Procedures: Yes  Date of Onset of Symptoms: 08/16/2012  Guideline(s) Used:  No Protocol Available - Sick  Adult  Disposition Per Guideline:   Home Care  Reason For Disposition Reached:   Patient's symptoms are safe to treat at home per nursing judgment  Advice Given:  N/A  Patient Will Follow Care Advice:  YES

## 2012-09-08 NOTE — Telephone Encounter (Signed)
I think OV would be a good idea.

## 2012-09-09 NOTE — Telephone Encounter (Signed)
Pt informed on VM Dr Caryl Never would like to discuss at OV, office number provided

## 2012-09-10 ENCOUNTER — Encounter: Payer: Self-pay | Admitting: Family Medicine

## 2012-09-10 ENCOUNTER — Ambulatory Visit (INDEPENDENT_AMBULATORY_CARE_PROVIDER_SITE_OTHER): Payer: BC Managed Care – PPO | Admitting: Family Medicine

## 2012-09-10 VITALS — BP 120/80 | Temp 97.6°F | Wt 118.0 lb

## 2012-09-10 DIAGNOSIS — F4323 Adjustment disorder with mixed anxiety and depressed mood: Secondary | ICD-10-CM

## 2012-09-10 MED ORDER — FLUOXETINE HCL 20 MG PO TABS: 20.0000 mg | ORAL_TABLET | Freq: Every day | ORAL | Status: DC

## 2012-09-10 MED ORDER — LORAZEPAM 0.5 MG PO TABS: 0.5000 mg | ORAL_TABLET | Freq: Three times a day (TID) | ORAL | Status: DC | PRN

## 2012-09-10 NOTE — Progress Notes (Signed)
  Subjective:    Patient ID: Vanessa Mccoy, female    DOB: 1955/08/08, 56 y.o.   MRN: [id number]  HPI History of depression Patient lost her mother last December and has battled some depression issues off and on. Remains on Prozac 10 mg daily. No suicidal ideation. Patient crying spells. Low motivation. Frequent anxiety symptoms. Insomnia issues. Poor appetite and recent loss of some weight. She has very good support system. Continues to work and exercise.  Past Medical History  Diagnosis Date  . DEPRESSION 11/13/2008  . Lumbago 11/13/2008  . ROTATOR CUFF SYNDROME 03/23/2010  . No pertinent past medical history   . PONV (postoperative nausea and vomiting)    Past Surgical History  Procedure Laterality Date  . Breast surgery  11/12    augmentation-bilat with saline implants  . Breast biopsy      many yrs ago-rt br cyst aspirated  . Appendectomy  1977    exp lap  . Breast biopsy  12/20/2011    Procedure: BREAST BIOPSY WITH NEEDLE LOCALIZATION;  Surgeon: Ernestene Mention, MD;  Location: [facility];  Service: General;  Laterality: Left;  Left breast biospy with Needle localization    reports that she has never smoked. She has never used smokeless tobacco. She reports that  drinks alcohol. She reports that she does not use illicit drugs. family history includes Cancer in her maternal grandmother; Diabetes in her maternal grandfather; Heart disease in her brother and mother; Stroke in her maternal grandfather; and Stroke (age of onset: 8) in her mother. Allergies  Allergen Reactions  . Codeine Sulfate     REACTION: GI upset  . Hydrocodone Nausea And Vomiting      Review of Systems  Constitutional: Positive for fatigue.  Respiratory: Negative for shortness of breath.   Cardiovascular: Negative for chest pain.  Neurological: Negative for headaches.  Psychiatric/Behavioral: Positive for dysphoric mood. Negative for agitation. The patient is nervous/anxious.         Objective:   Physical Exam  Constitutional: She appears well-developed and well-nourished.  Cardiovascular: Normal rate and regular rhythm.   Pulmonary/Chest: Effort normal and breath sounds normal. No respiratory distress. She has no wheezes. She has no rales.  Psychiatric: She has a normal mood and affect. Her behavior is normal.          Assessment & Plan:  Adjustment disorder with mixed anxiety and depression. Increase Prozac to 20 mg daily. Limited lorazepam 0.5 mg one every 8 hours as needed for severe anxiety #30 tablets only with no refill She has already started back recent counseling and will continue with this. Touch base 2-3 weeks if no additional improvements

## 2012-12-10 ENCOUNTER — Encounter (INDEPENDENT_AMBULATORY_CARE_PROVIDER_SITE_OTHER): Payer: BC Managed Care – PPO | Admitting: Ophthalmology

## 2012-12-10 DIAGNOSIS — H2510 Age-related nuclear cataract, unspecified eye: Secondary | ICD-10-CM

## 2012-12-10 DIAGNOSIS — H43819 Vitreous degeneration, unspecified eye: Secondary | ICD-10-CM

## 2013-01-09 ENCOUNTER — Other Ambulatory Visit: Payer: Self-pay | Admitting: Obstetrics and Gynecology

## 2013-01-09 DIAGNOSIS — Z780 Asymptomatic menopausal state: Secondary | ICD-10-CM

## 2013-01-09 DIAGNOSIS — Z1231 Encounter for screening mammogram for malignant neoplasm of breast: Secondary | ICD-10-CM

## 2013-02-24 ENCOUNTER — Ambulatory Visit
Admission: RE | Admit: 2013-02-24 | Discharge: 2013-02-24 | Disposition: A | Discharge status: Home / Self Care | Payer: BC Managed Care – PPO | Source: Ambulatory Visit | Attending: Obstetrics and Gynecology | Admitting: Obstetrics and Gynecology

## 2013-02-24 ENCOUNTER — Other Ambulatory Visit: Payer: Self-pay | Admitting: Obstetrics and Gynecology

## 2013-02-24 DIAGNOSIS — Z780 Asymptomatic menopausal state: Secondary | ICD-10-CM

## 2013-02-24 DIAGNOSIS — Z1231 Encounter for screening mammogram for malignant neoplasm of breast: Secondary | ICD-10-CM

## 2013-04-10 ENCOUNTER — Ambulatory Visit: Payer: BC Managed Care – PPO | Admitting: Family Medicine

## 2013-04-28 ENCOUNTER — Encounter: Payer: Self-pay | Admitting: Nurse Practitioner

## 2013-04-28 ENCOUNTER — Ambulatory Visit (INDEPENDENT_AMBULATORY_CARE_PROVIDER_SITE_OTHER): Payer: BC Managed Care – PPO | Admitting: Nurse Practitioner

## 2013-04-28 VITALS — BP 90/54 | HR 61 | Temp 97.5°F | Ht 66.0 in | Wt 125.0 lb

## 2013-04-28 DIAGNOSIS — H6590 Unspecified nonsuppurative otitis media, unspecified ear: Secondary | ICD-10-CM

## 2013-04-28 DIAGNOSIS — H6591 Unspecified nonsuppurative otitis media, right ear: Secondary | ICD-10-CM

## 2013-04-28 MED ORDER — FLUTICASONE PROPIONATE 50 MCG/ACT NA SUSP: NASAL | Status: DC

## 2013-04-28 NOTE — Progress Notes (Signed)
   Subjective:    Patient ID: Vanessa Mccoy, female    DOB: 12/22/1955, 58 y.o.   MRN: [id number]  HPI Comments: Pt feels like she is getting better, but concerned that cough is still lingering & ears are bothering her.  Cough This is a chronic problem. The current episode started more than 1 month ago (6 weeks). The problem has been gradually improving. The problem occurs every few hours. The cough is non-productive. Associated symptoms include ear congestion, ear pain (ear fullness) and shortness of breath. Pertinent negatives include no chest pain, chills, fever, headaches, nasal congestion, postnasal drip, sore throat or wheezing.      Review of Systems  Constitutional: Negative for fever, chills and fatigue.  HENT: Positive for ear pain (ear fullness). Negative for congestion, postnasal drip and sore throat.   Respiratory: Positive for cough and shortness of breath. Negative for wheezing.   Cardiovascular: Negative for chest pain.  Gastrointestinal: Negative for abdominal pain.  Musculoskeletal: Negative for back pain.  Neurological: Negative for headaches.  Hematological: Negative for adenopathy.       Objective:   Physical Exam  Vitals reviewed. Constitutional: She is oriented to person, place, and time. She appears well-developed and well-nourished. No distress.  HENT:  Head: Normocephalic and atraumatic.  Right Ear: External ear normal.  Left Ear: External ear normal.  Mouth/Throat: Oropharynx is clear and moist. No oropharyngeal exudate.  Scant dry crusting nares, clear fluid visible R TM  Eyes: Conjunctivae are normal. Right eye exhibits no discharge. Left eye exhibits no discharge.  Neck: Normal range of motion. Neck supple. No thyromegaly present.  Cardiovascular: Normal rate, regular rhythm and normal heart sounds.   Pulmonary/Chest: Effort normal and breath sounds normal. No respiratory distress. She has no wheezes.  Lymphadenopathy:    She has no cervical  adenopathy.  Neurological: She is alert and oriented to person, place, and time.  Skin: Skin is warm and dry.  Psychiatric: She has a normal mood and affect. Her behavior is normal. Thought content normal.          Assessment & Plan:  1. Otitis media with effusion, right - fluticasone (FLONASE) 50 MCG/ACT nasal spray; 1 spray R nare twice daily for at least 10 to 14 days.  Dispense: 16 g; Refill: 3  2 cough likley post-viral pneumonitis F/u 2 weeks if not resolved.

## 2013-04-28 NOTE — Progress Notes (Signed)
[facility]

## 2013-04-28 NOTE — Patient Instructions (Signed)
Use flonase for at least 10 days. Also use mechanical method as discussed to help ear drain.  Otitis Media with Effusion Otitis media with effusion is the presence of fluid in the middle ear. This is a common problem that often follows ear infections. It may be present for weeks or longer after the infection. Unlike an acute ear infection, otits media with effusion refers only to fluid behind the ear drum and not infection. Children with repeated ear and sinus infections and allergy problems are the most likely to get otitis media with effusion. CAUSES  The most frequent cause of the fluid buildup is dysfunction of the eustacian tubes. These are the tubes that drain fluid in the ears to the throat. SYMPTOMS   The main symptom of this condition is hearing loss. As a result, you or your child may:  Listen to the TV at a loud volume.  Not respond to questions.  Ask "what" often when spoken to.  There may be a sensation of fullness or pressure but usually not pain. DIAGNOSIS   Your caregiver will diagnose this condition by examining you or your child's ears.  Your caregiver may test the pressure in you or your child's ear with a tympanometer.  A hearing test may be conducted if the problem persists.  A caregiver will want to re-evaluate the condition periodically to see if it improves. TREATMENT   Treatment depends on the duration and the effects of the effusion.  Antibiotics, decongestants, nose drops, and cortisone-type drugs may not be helpful.  Children with persistent ear effusions may have delayed language. Children at risk for developmental delays in hearing, learning, and speech may require referral to a specialist earlier than children not at risk.  You or your child's caregiver may suggest a referral to an Ear, Nose, and Throat (ENT) surgeon for treatment. The following may help restore normal hearing:  Drainage of fluid.  Placement of ear tubes (tympanostomy  tubes).  Removal of adenoids (adenoidectomy). HOME CARE INSTRUCTIONS   Avoid second hand smoke.  Infants who are breast fed are less likely to have this condition.  Avoid feeding infants while laying flat.  Avoid known environmental allergens.  Be sure to see a caregiver or an ENT specialist for follow up.  Avoid people who are sick. SEEK MEDICAL CARE IF:   Hearing is not better in 3 months.  Hearing is worse.  Ear pain.  Drainage from the ear.  Dizziness. Document Released: 05/18/2004 Document Revised: 07/03/2011 Document Reviewed: 11/05/2012 [facility] Patient Information 2014 [employer], [place].

## 2013-09-01 ENCOUNTER — Telehealth: Payer: Self-pay | Admitting: Family Medicine

## 2013-09-01 NOTE — Telephone Encounter (Signed)
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 Is it ok tio schedule? Pt also has form to be filled out for this.

## 2013-09-01 NOTE — Telephone Encounter (Signed)
Pt was last seen 04/2012

## 2013-09-01 NOTE — Telephone Encounter (Signed)
Yes,  OK to get MMR titer.

## 2013-09-02 ENCOUNTER — Other Ambulatory Visit: Payer: Self-pay | Admitting: Family Medicine

## 2013-09-02 DIAGNOSIS — Z0000 Encounter for general adult medical examination without abnormal findings: Secondary | ICD-10-CM

## 2013-09-02 NOTE — Telephone Encounter (Signed)
Yes for lab

## 2013-09-03 NOTE — Telephone Encounter (Signed)
Pt scheduled  

## 2013-09-04 ENCOUNTER — Other Ambulatory Visit: Payer: BC Managed Care – PPO

## 2013-09-08 ENCOUNTER — Other Ambulatory Visit (INDEPENDENT_AMBULATORY_CARE_PROVIDER_SITE_OTHER): Payer: BC Managed Care – PPO

## 2013-09-08 DIAGNOSIS — Z0000 Encounter for general adult medical examination without abnormal findings: Secondary | ICD-10-CM

## 2013-09-08 NOTE — Patient Instructions (Signed)
, °

## 2013-09-09 LAB — MEASLES/MUMPS/RUBELLA IMMUNITY
Mumps IgG: 165 AU/mL — ABNORMAL HIGH (ref <9.00)
RUBELLA: 2.5 {index} — AB (ref <0.90)
Rubeola IgG: >300 AU/mL — ABNORMAL HIGH (ref <25.00)

## 2013-11-17 ENCOUNTER — Telehealth: Payer: Self-pay | Admitting: Family Medicine

## 2013-11-17 MED ORDER — FLUOXETINE HCL 20 MG PO TABS: 20.0000 mg | ORAL_TABLET | Freq: Every day | ORAL | Status: DC

## 2013-11-17 NOTE — Telephone Encounter (Signed)
RX sent to pharmacy  

## 2013-11-17 NOTE — Telephone Encounter (Signed)
Pt request refill of the following:    FLUoxetine (PROZAC) 20 MG tablet   [facility] [address]

## 2014-01-05 ENCOUNTER — Ambulatory Visit (INDEPENDENT_AMBULATORY_CARE_PROVIDER_SITE_OTHER): Payer: BC Managed Care – PPO | Admitting: Family Medicine

## 2014-01-05 ENCOUNTER — Encounter: Payer: Self-pay | Admitting: Family Medicine

## 2014-01-05 VITALS — BP 112/72 | HR 62 | Temp 97.6°F | Wt 127.0 lb

## 2014-01-05 DIAGNOSIS — R202 Paresthesia of skin: Secondary | ICD-10-CM

## 2014-01-05 DIAGNOSIS — R209 Unspecified disturbances of skin sensation: Secondary | ICD-10-CM

## 2014-01-05 DIAGNOSIS — R42 Dizziness and giddiness: Secondary | ICD-10-CM

## 2014-01-05 DIAGNOSIS — R51 Headache: Secondary | ICD-10-CM

## 2014-01-05 NOTE — Patient Instructions (Signed)
We will call you with MRI of head

## 2014-01-05 NOTE — Progress Notes (Signed)
Subjective:    Patient ID: Vanessa Mccoy, female    DOB: 1955/07/29, 58 y.o.   MRN: [id number]  Sinus Problem Associated symptoms include headaches. Pertinent negatives include no chills or shortness of breath.    Patient seen for evaluation of atypical headaches. For approximately 5 months she's had headaches which frequently wake her from sleep. These are usually right occipital area. No radiation. She had migraine headaches as a child but states these headaches are different. She has difficulty describing quality but more sharp than anything. 4 out 10 severity. No nausea or vomiting. No visual changes. No focal weakness. She's also had fairly frequent vertigo and has occasionally ringing on the right side. She's not any clear cut loss of hearing on the right compared to the left.  Denies any recent head injury. No focal weakness. She has some intermittent facial pressure but no purulent nasal discharge. She also describes intermittent tingling sensation right scalp. No rashes. Denies any sensory changes in her extremities  Past Medical History  Diagnosis Date  . DEPRESSION 11/13/2008  . Lumbago 11/13/2008  . ROTATOR CUFF SYNDROME 03/23/2010  . No pertinent past medical history   . PONV (postoperative nausea and vomiting)    Past Surgical History  Procedure Laterality Date  . Breast surgery  11/12    augmentation-bilat with saline implants  . Breast biopsy      many yrs ago-rt br cyst aspirated  . Appendectomy  1977    exp lap  . Breast biopsy  12/20/2011    Procedure: BREAST BIOPSY WITH NEEDLE LOCALIZATION;  Surgeon: Ernestene Mention, MD;  Location: [facility];  Service: General;  Laterality: Left;  Left breast biospy with Needle localization    reports that she has never smoked. She has never used smokeless tobacco. She reports that she drinks alcohol. She reports that she does not use illicit drugs. family history includes Cancer in her maternal grandmother; Diabetes  in her maternal grandfather; Heart disease in her brother and mother; Stroke in her maternal grandfather; Stroke (age of onset: 53) in her mother. Allergies  Allergen Reactions  . Codeine Sulfate     REACTION: GI upset  . Hydrocodone Nausea And Vomiting     Review of Systems  Constitutional: Negative for fever, chills, appetite change and unexpected weight change.  Respiratory: Negative for shortness of breath.   Cardiovascular: Negative for chest pain.  Gastrointestinal: Negative for abdominal pain.  Neurological: Positive for headaches. Negative for seizures, syncope and weakness.  Hematological: Negative for adenopathy.  Psychiatric/Behavioral: Negative for confusion.       Objective:   Physical Exam  Constitutional: She is oriented to person, place, and time. She appears well-developed and well-nourished.  HENT:  Right Ear: External ear normal.  Left Ear: External ear normal.  Eyes: EOM are normal. Pupils are equal, round, and reactive to light.  Neck: Neck supple. No thyromegaly present.  Cardiovascular: Normal rate and regular rhythm.   Pulmonary/Chest: Effort normal and breath sounds normal. No respiratory distress. She has no wheezes. She has no rales.  Musculoskeletal: She exhibits no edema.  Lymphadenopathy:    She has no cervical adenopathy.  Neurological: She is alert and oriented to person, place, and time. No cranial nerve deficit. Coordination normal.  No focal weakness          Assessment & Plan:  Patient presents with atypical headaches progressive over the past 4-5 months. These do not sound like tension type headaches or migraines.  They're localized right occipital. She also has symptoms of intermittent tingling right scalp, intermittent vertigo and unilateral tinnitus. Set up MRI to further assess.

## 2014-01-05 NOTE — Progress Notes (Signed)
Pre visit review using our clinic review tool, if applicable. No additional management support is needed unless otherwise documented below in the visit note. 

## 2014-01-07 ENCOUNTER — Emergency Department (HOSPITAL_BASED_OUTPATIENT_CLINIC_OR_DEPARTMENT_OTHER)
Admission: EM | Admit: 2014-01-07 | Discharge: 2014-01-07 | Disposition: A | Discharge status: Home / Self Care | Payer: BC Managed Care – PPO | Attending: Emergency Medicine | Admitting: Emergency Medicine

## 2014-01-07 ENCOUNTER — Encounter (HOSPITAL_BASED_OUTPATIENT_CLINIC_OR_DEPARTMENT_OTHER): Payer: Self-pay | Admitting: Emergency Medicine

## 2014-01-07 DIAGNOSIS — Y9389 Activity, other specified: Secondary | ICD-10-CM | POA: Diagnosis not present

## 2014-01-07 DIAGNOSIS — Z79899 Other long term (current) drug therapy: Secondary | ICD-10-CM | POA: Diagnosis not present

## 2014-01-07 DIAGNOSIS — W268XXA Contact with other sharp object(s), not elsewhere classified, initial encounter: Secondary | ICD-10-CM | POA: Diagnosis not present

## 2014-01-07 DIAGNOSIS — F3289 Other specified depressive episodes: Secondary | ICD-10-CM | POA: Insufficient documentation

## 2014-01-07 DIAGNOSIS — F329 Major depressive disorder, single episode, unspecified: Secondary | ICD-10-CM | POA: Diagnosis not present

## 2014-01-07 DIAGNOSIS — S61219A Laceration without foreign body of unspecified finger without damage to nail, initial encounter: Secondary | ICD-10-CM

## 2014-01-07 DIAGNOSIS — S61209A Unspecified open wound of unspecified finger without damage to nail, initial encounter: Secondary | ICD-10-CM | POA: Diagnosis not present

## 2014-01-07 DIAGNOSIS — Z8739 Personal history of other diseases of the musculoskeletal system and connective tissue: Secondary | ICD-10-CM | POA: Diagnosis not present

## 2014-01-07 DIAGNOSIS — IMO0002 Reserved for concepts with insufficient information to code with codable children: Secondary | ICD-10-CM | POA: Insufficient documentation

## 2014-01-07 DIAGNOSIS — Y929 Unspecified place or not applicable: Secondary | ICD-10-CM | POA: Insufficient documentation

## 2014-01-07 MED ORDER — HYDROCODONE-ACETAMINOPHEN 5-325 MG PO TABS: 1.0000 | ORAL_TABLET | ORAL | Status: DC | PRN

## 2014-01-07 NOTE — ED Notes (Signed)
Laceration to 5th digit left hand.

## 2014-01-07 NOTE — ED Provider Notes (Signed)
CSN: [id number]     Arrival date & time 01/07/14  1705 History   First MD Initiated Contact with Patient 01/07/14 1745     Chief Complaint  Patient presents with  . Finger Injury     (Consider location/radiation/quality/duration/timing/severity/associated sxs/prior Treatment) HPI Comments: Patient complains of laceration to fifth digit of left hand from mandolin while slicing lemons. Happened just prior to arrival. No weakness, numbness or tingling. Tetanus is up-to-date.  The history is provided by the patient.    Past Medical History  Diagnosis Date  . DEPRESSION 11/13/2008  . Lumbago 11/13/2008  . ROTATOR CUFF SYNDROME 03/23/2010  . No pertinent past medical history   . PONV (postoperative nausea and vomiting)    Past Surgical History  Procedure Laterality Date  . Breast surgery  11/12    augmentation-bilat with saline implants  . Breast biopsy      many yrs ago-rt br cyst aspirated  . Appendectomy  1977    exp lap  . Breast biopsy  12/20/2011    Procedure: BREAST BIOPSY WITH NEEDLE LOCALIZATION;  Surgeon: Ernestene Mention, MD;  Location: [facility];  Service: General;  Laterality: Left;  Left breast biospy with Needle localization   Family History  Problem Relation Age of Onset  . Heart disease Mother   . Stroke Mother 58    died ?hemorrhagic CVA  . Heart disease Brother   . Cancer Maternal Grandmother     cervical & lung  . Diabetes Maternal Grandfather   . Stroke Maternal Grandfather    History  Substance Use Topics  . Smoking status: Never Smoker   . Smokeless tobacco: Never Used  . Alcohol Use: 0.0 oz/week     Comment: 1-2 glasses of wine/ week   OB History   Grav Para Term Preterm Abortions TAB SAB Ect Mult Living                 Review of Systems  Constitutional: Negative for fever, activity change and appetite change.  Respiratory: Negative for cough, chest tightness and shortness of breath.   Cardiovascular: Negative for chest pain.   Gastrointestinal: Negative for nausea, vomiting and abdominal pain.  Genitourinary: Negative for dysuria and hematuria.  Musculoskeletal: Negative for arthralgias, back pain, myalgias and neck pain.  Skin: Positive for wound.  Neurological: Negative for dizziness, weakness and headaches.  A complete 10 system review of systems was obtained and all systems are negative except as noted in the HPI and PMH.      Allergies  Codeine sulfate and Hydrocodone  Home Medications   Prior to Admission medications   Medication Sig Start Date End Date Taking? Authorizing Provider  FLUoxetine (PROZAC) 10 MG tablet Take 10 mg by mouth daily.    Historical Provider, MD  fluticasone (FLONASE) 50 MCG/ACT nasal spray 1 spray R nare twice daily for at least 10 to 14 days. 04/28/13   Kelle Darting, NP  HYDROcodone-acetaminophen (NORCO/VICODIN) 5-325 MG per tablet Take 1 tablet by mouth every 4 (four) hours as needed. 01/07/14   Glynn Octave, MD   BP 125/71  Temp(Src) 97.8 F (36.6 C) (Oral)  Resp 16  Ht  (1.676 m)  Wt 123 lb (55.792 kg)  BMI 19.86 kg/m2  SpO2 100% Physical Exam  Nursing note and vitals reviewed. Constitutional: She is oriented to person, place, and time. She appears well-developed and well-nourished. No distress.  HENT:  Head: Normocephalic and atraumatic.  Mouth/Throat: Oropharynx is clear and  moist. No oropharyngeal exudate.  Eyes: Conjunctivae and EOM are normal. Pupils are equal, round, and reactive to light.  Neck: Normal range of motion. Neck supple.  No meningismus.  Cardiovascular: Normal rate, regular rhythm, normal heart sounds and intact distal pulses.   No murmur heard. Pulmonary/Chest: Effort normal and breath sounds normal. No respiratory distress.  Abdominal: Soft. There is no tenderness. There is no rebound and no guarding.  Musculoskeletal: Normal range of motion. She exhibits no edema and no tenderness.  1.5 cm laceration to left fifth digit through  distal nail bed. Active capillary oozing. DIP and PIP flexion intact. Extension intact,  Sensation intact  Neurological: She is alert and oriented to person, place, and time. No cranial nerve deficit. She exhibits normal muscle tone. Coordination normal.  No ataxia on finger to nose bilaterally. No pronator drift. 5/5 strength throughout. CN 2-12 intact. Negative Romberg. Equal grip strength. Sensation intact. Gait is normal.   Skin: Skin is warm.  Psychiatric: She has a normal mood and affect. Her behavior is normal.    ED Course  LACERATION REPAIR Date/Time: 01/07/2014 6:10 PM Performed by: Glynn Octave Authorized by: Glynn Octave Consent: Verbal consent obtained. Risks and benefits: risks, benefits and alternatives were discussed Consent given by: patient Patient understanding: patient states understanding of the procedure being performed Patient consent: the patient's understanding of the procedure matches consent given Procedure consent: procedure consent matches procedure scheduled Relevant documents: relevant documents present and verified Test results: test results available and properly labeled Site marked: the operative site was marked Imaging studies: imaging studies available Patient identity confirmed: verbally with patient and arm band Time out: Immediately prior to procedure a "time out" was called to verify the correct patient, procedure, equipment, support staff and site/side marked as required. Body area: upper extremity Location details: left small finger Laceration length: 2 cm Tendon involvement: none Nerve involvement: none Vascular damage: yes Anesthesia: local infiltration and digital block Local anesthetic: lidocaine 1% without epinephrine Patient sedated: no Preparation: Patient was prepped and draped in the usual sterile fashion. Irrigation solution: saline Irrigation method: syringe Amount of cleaning: standard Debridement: none Degree of  undermining: none Skin closure: 5-0 nylon Number of sutures: 5 Technique: simple Approximation: close Approximation difficulty: complex Dressing: 4x4 sterile gauze Patient tolerance: Patient tolerated the procedure well with no immediate complications.   (including critical care time) Labs Review Labs Reviewed - No data to display  Imaging Review No results found.   EKG Interpretation None      MDM   Final diagnoses:  Finger laceration, initial encounter   Laceration to left fifth digit from mandolin. Neurovascularly intact. tetanus up-to-date.  Wound sutured as above. Hemostasis achieved. Patient to follow up in 5-7 days for suture removal. Return precautions discussed.   Glynn Octave, MD 01/07/14 [phone]

## 2014-01-07 NOTE — Discharge Instructions (Signed)
Laceration Care, Adult °Follow up for suture removal in 1 week. Return to the ED if you develop new or worsening symptoms. °A laceration is a cut or lesion that goes through all layers of the skin and into the tissue just beneath the skin. °TREATMENT  °Some lacerations may not require closure. Some lacerations may not be able to be closed due to an increased risk of infection. It is important to see your caregiver as soon as possible after an injury to minimize the risk of infection and maximize the opportunity for successful closure. °If closure is appropriate, pain medicines may be given, if needed. The wound will be cleaned to help prevent infection. Your caregiver will use stitches (sutures), staples, wound glue (adhesive), or skin adhesive strips to repair the laceration. These tools bring the skin edges together to allow for faster healing and a better cosmetic outcome. However, all wounds will heal with a scar. Once the wound has healed, scarring can be minimized by covering the wound with sunscreen during the day for 1 full year. °HOME CARE INSTRUCTIONS  °For sutures or staples: °· Keep the wound clean and dry. °· If you were given a bandage (dressing), you should change it at least once a day. Also, change the dressing if it becomes wet or dirty, or as directed by your caregiver. °· Wash the wound with soap and water 2 times a day. Rinse the wound off with water to remove all soap. Pat the wound dry with a clean towel. °· After cleaning, apply a thin layer of the antibiotic ointment as recommended by your caregiver. This will help prevent infection and keep the dressing from sticking. °· You may shower as usual after the first 24 hours. Do not soak the wound in water until the sutures are removed. °· Only take over-the-counter or prescription medicines for pain, discomfort, or fever as directed by your caregiver. °· Get your sutures or staples removed as directed by your caregiver. °For skin adhesive  strips: °· Keep the wound clean and dry. °· Do not get the skin adhesive strips wet. You may bathe carefully, using caution to keep the wound dry. °· If the wound gets wet, pat it dry with a clean towel. °· Skin adhesive strips will fall off on their own. You may trim the strips as the wound heals. Do not remove skin adhesive strips that are still stuck to the wound. They will fall off in time. °For wound adhesive: °· You may briefly wet your wound in the shower or bath. Do not soak or scrub the wound. Do not swim. Avoid periods of heavy perspiration until the skin adhesive has fallen off on its own. After showering or bathing, gently pat the wound dry with a clean towel. °· Do not apply liquid medicine, cream medicine, or ointment medicine to your wound while the skin adhesive is in place. This may loosen the film before your wound is healed. °· If a dressing is placed over the wound, be careful not to apply tape directly over the skin adhesive. This may cause the adhesive to be pulled off before the wound is healed. °· Avoid prolonged exposure to sunlight or tanning lamps while the skin adhesive is in place. Exposure to ultraviolet light in the first year will darken the scar. °· The skin adhesive will usually remain in place for 5 to 10 days, then naturally fall off the skin. Do not pick at the adhesive film. °You may need a tetanus shot   if:  You cannot remember when you had your last tetanus shot.  You have never had a tetanus shot. If you get a tetanus shot, your arm may swell, get red, and feel warm to the touch. This is common and not a problem. If you need a tetanus shot and you choose not to have one, there is a rare chance of getting tetanus. Sickness from tetanus can be serious. SEEK MEDICAL CARE IF:   You have redness, swelling, or increasing pain in the wound.  You see a red line that goes away from the wound.  You have yellowish-white fluid (pus) coming from the wound.  You have a  fever.  You notice a bad smell coming from the wound or dressing.  Your wound breaks open before or after sutures have been removed.  You notice something coming out of the wound such as wood or glass.  Your wound is on your hand or foot and you cannot move a finger or toe. SEEK IMMEDIATE MEDICAL CARE IF:   Your pain is not controlled with prescribed medicine.  You have severe swelling around the wound causing pain and numbness or a change in color in your arm, hand, leg, or foot.  Your wound splits open and starts bleeding.  You have worsening numbness, weakness, or loss of function of any joint around or beyond the wound.  You develop painful lumps near the wound or on the skin anywhere on your body. MAKE SURE YOU:   Understand these instructions.  Will watch your condition.  Will get help right away if you are not doing well or get worse. Document Released: 04/10/2005 Document Revised: 07/03/2011 Document Reviewed: 10/04/2010 [facility] Patient Information 2015 [place], [place]. This information is not intended to replace advice given to you by your health care provider. Make sure you discuss any questions you have with your health care provider.

## 2014-01-13 ENCOUNTER — Other Ambulatory Visit: Payer: BC Managed Care – PPO

## 2014-01-14 ENCOUNTER — Encounter: Payer: Self-pay | Admitting: Family Medicine

## 2014-01-14 ENCOUNTER — Ambulatory Visit (INDEPENDENT_AMBULATORY_CARE_PROVIDER_SITE_OTHER): Payer: BC Managed Care – PPO | Admitting: Family Medicine

## 2014-01-14 VITALS — BP 106/68 | HR 67 | Wt 127.0 lb

## 2014-01-14 DIAGNOSIS — S61219D Laceration without foreign body of unspecified finger without damage to nail, subsequent encounter: Secondary | ICD-10-CM

## 2014-01-14 DIAGNOSIS — Z5189 Encounter for other specified aftercare: Secondary | ICD-10-CM

## 2014-01-14 DIAGNOSIS — S61209A Unspecified open wound of unspecified finger without damage to nail, initial encounter: Secondary | ICD-10-CM

## 2014-01-14 NOTE — Progress Notes (Signed)
Pre visit review using our clinic review tool, if applicable. No additional management support is needed unless otherwise documented below in the visit note. 

## 2014-01-14 NOTE — Progress Notes (Signed)
   Subjective:    Patient ID: Vanessa Mccoy, female    DOB: [birth year], 58 y.o.   MRN: [id number]  Suture / Staple Removal   Followup laceration left fifth digit. She accidentally cut this with kitchen knife 7 days ago. She went to emergency room. Her tetanus up-to-date. She's had no pain. No bleeding. No drainage.  Past Medical History  Diagnosis Date  . DEPRESSION 11/13/2008  . Lumbago 11/13/2008  . ROTATOR CUFF SYNDROME 03/23/2010  . No pertinent past medical history   . PONV (postoperative nausea and vomiting)    Past Surgical History  Procedure Laterality Date  . Breast surgery  11/12    augmentation-bilat with saline implants  . Breast biopsy      many yrs ago-rt br cyst aspirated  . Appendectomy  1977    exp lap  . Breast biopsy  12/20/2011    Procedure: BREAST BIOPSY WITH NEEDLE LOCALIZATION;  Surgeon: Ernestene Mention, MD;  Location: [facility];  Service: General;  Laterality: Left;  Left breast biospy with Needle localization    reports that she has never smoked. She has never used smokeless tobacco. She reports that she drinks alcohol. She reports that she does not use illicit drugs. family history includes Cancer in her maternal grandmother; Diabetes in her maternal grandfather; Heart disease in her brother and mother; Stroke in her maternal grandfather; Stroke (age of onset: 54) in her mother. Allergies  Allergen Reactions  . Codeine Sulfate     REACTION: GI upset  . Hydrocodone Nausea And Vomiting      Review of Systems  Constitutional: Negative for fever and chills.       Objective:   Physical Exam  Constitutional: She appears well-developed and well-nourished.  Skin:  Left fifth finger distally reveals approximately 1-1/2 cm laceration. 5 Sutures in place. These are removed without difficulty. No erythema. No drainage. No edema.          Assessment & Plan:  Laceration left fifth digit. Healing well. Sutures removed. 2 Steri-Strips  placed. Followup for signs of secondary infection.

## 2014-01-16 ENCOUNTER — Ambulatory Visit
Admission: RE | Admit: 2014-01-16 | Discharge: 2014-01-16 | Disposition: A | Discharge status: Home / Self Care | Payer: BC Managed Care – PPO | Source: Ambulatory Visit | Attending: Family Medicine | Admitting: Family Medicine

## 2014-01-16 DIAGNOSIS — R51 Headache: Secondary | ICD-10-CM

## 2014-01-16 DIAGNOSIS — R202 Paresthesia of skin: Secondary | ICD-10-CM

## 2014-01-16 DIAGNOSIS — R42 Dizziness and giddiness: Secondary | ICD-10-CM

## 2014-01-16 MED ORDER — GADOBENATE DIMEGLUMINE 529 MG/ML IV SOLN
11.0000 mL | Freq: Once | INTRAVENOUS | Status: AC | PRN
  Administered 2014-01-16: 11 mL via INTRAVENOUS

## 2014-01-20 ENCOUNTER — Other Ambulatory Visit: Payer: BC Managed Care – PPO

## 2014-02-27 ENCOUNTER — Other Ambulatory Visit: Payer: Self-pay | Admitting: Family Medicine

## 2014-04-29 ENCOUNTER — Other Ambulatory Visit: Payer: Self-pay | Admitting: Obstetrics and Gynecology

## 2014-04-30 LAB — CYTOLOGY - PAP

## 2014-07-19 ENCOUNTER — Encounter (HOSPITAL_BASED_OUTPATIENT_CLINIC_OR_DEPARTMENT_OTHER): Payer: Self-pay | Admitting: *Deleted

## 2014-07-19 ENCOUNTER — Emergency Department (HOSPITAL_BASED_OUTPATIENT_CLINIC_OR_DEPARTMENT_OTHER)
Admission: EM | Admit: 2014-07-19 | Discharge: 2014-07-19 | Disposition: A | Discharge status: Home / Self Care | Payer: BLUE CROSS/BLUE SHIELD | Attending: Emergency Medicine | Admitting: Emergency Medicine

## 2014-07-19 DIAGNOSIS — S4991XA Unspecified injury of right shoulder and upper arm, initial encounter: Secondary | ICD-10-CM | POA: Insufficient documentation

## 2014-07-19 DIAGNOSIS — Y92410 Unspecified street and highway as the place of occurrence of the external cause: Secondary | ICD-10-CM | POA: Diagnosis not present

## 2014-07-19 DIAGNOSIS — F329 Major depressive disorder, single episode, unspecified: Secondary | ICD-10-CM | POA: Diagnosis not present

## 2014-07-19 DIAGNOSIS — Z79899 Other long term (current) drug therapy: Secondary | ICD-10-CM | POA: Insufficient documentation

## 2014-07-19 DIAGNOSIS — S199XXA Unspecified injury of neck, initial encounter: Secondary | ICD-10-CM | POA: Insufficient documentation

## 2014-07-19 DIAGNOSIS — Y9389 Activity, other specified: Secondary | ICD-10-CM | POA: Insufficient documentation

## 2014-07-19 DIAGNOSIS — Z7951 Long term (current) use of inhaled steroids: Secondary | ICD-10-CM | POA: Insufficient documentation

## 2014-07-19 DIAGNOSIS — Z8739 Personal history of other diseases of the musculoskeletal system and connective tissue: Secondary | ICD-10-CM | POA: Insufficient documentation

## 2014-07-19 DIAGNOSIS — Y998 Other external cause status: Secondary | ICD-10-CM | POA: Diagnosis not present

## 2014-07-19 DIAGNOSIS — M25511 Pain in right shoulder: Secondary | ICD-10-CM

## 2014-07-19 MED ORDER — OXYCODONE-ACETAMINOPHEN 5-325 MG PO TABS: 1.0000 | ORAL_TABLET | Freq: Four times a day (QID) | ORAL | Status: DC | PRN
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
## 2014-12-23 ENCOUNTER — Other Ambulatory Visit (INDEPENDENT_AMBULATORY_CARE_PROVIDER_SITE_OTHER): Payer: BLUE CROSS/BLUE SHIELD

## 2014-12-23 DIAGNOSIS — Z0000 Encounter for general adult medical examination without abnormal findings: Secondary | ICD-10-CM | POA: Diagnosis not present

## 2014-12-23 LAB — LIPID PANEL
CHOL/HDL RATIO: 3
Cholesterol: 177 mg/dL (ref 0–200)
HDL: 70.3 mg/dL (ref >39.00)
LDL Cholesterol: 96 mg/dL (ref 0–99)
NONHDL: 106.59
Triglycerides: 53 mg/dL (ref 0.0–149.0)
VLDL: 10.6 mg/dL (ref 0.0–40.0)

## 2014-12-23 LAB — BASIC METABOLIC PANEL
BUN: 11 mg/dL (ref 6–23)
CALCIUM: 9.7 mg/dL (ref 8.4–10.5)
CO2: 28 mEq/L (ref 19–32)
CREATININE: 0.88 mg/dL (ref 0.40–1.20)
Chloride: 104 mEq/L (ref 96–112)
GFR: 69.92 mL/min (ref >60.00)
Glucose, Bld: 98 mg/dL (ref 70–99)
Potassium: 4.1 mEq/L (ref 3.5–5.1)
Sodium: 138 mEq/L (ref 135–145)

## 2014-12-23 LAB — CBC WITH DIFFERENTIAL/PLATELET
BASOS ABS: 0.1 10*3/uL (ref 0.0–0.1)
Basophils Relative: 1.1 % (ref 0.0–3.0)
Eosinophils Absolute: 0.2 10*3/uL (ref 0.0–0.7)
Eosinophils Relative: 3.1 % (ref 0.0–5.0)
HCT: 37.5 % (ref 36.0–46.0)
HEMOGLOBIN: 12.8 g/dL (ref 12.0–15.0)
LYMPHS ABS: 1.9 10*3/uL (ref 0.7–4.0)
Lymphocytes Relative: 37.9 % (ref 12.0–46.0)
MCHC: 34.2 g/dL (ref 30.0–36.0)
MCV: 94 fl (ref 78.0–100.0)
MONOS PCT: 7.5 % (ref 3.0–12.0)
Monocytes Absolute: 0.4 10*3/uL (ref 0.1–1.0)
NEUTROS PCT: 50.4 % (ref 43.0–77.0)
Neutro Abs: 2.5 10*3/uL (ref 1.4–7.7)
Platelets: 254 10*3/uL (ref 150.0–400.0)
RBC: 3.98 Mil/uL (ref 3.87–5.11)
RDW: 12.9 % (ref 11.5–15.5)
WBC: 5 10*3/uL (ref 4.0–10.5)

## 2014-12-23 LAB — TSH: TSH: 1.37 u[IU]/mL (ref 0.35–4.50)

## 2014-12-23 LAB — HEPATIC FUNCTION PANEL
ALK PHOS: 57 U/L (ref 39–117)
ALT: 15 U/L (ref 0–35)
AST: 22 U/L (ref 0–37)
Albumin: 4.3 g/dL (ref 3.5–5.2)
BILIRUBIN TOTAL: 0.6 mg/dL (ref 0.2–1.2)
Bilirubin, Direct: 0.1 mg/dL (ref 0.0–0.3)
Total Protein: 7.3 g/dL (ref 6.0–8.3)

## 2014-12-29 ENCOUNTER — Ambulatory Visit (INDEPENDENT_AMBULATORY_CARE_PROVIDER_SITE_OTHER): Payer: BLUE CROSS/BLUE SHIELD | Admitting: Family Medicine

## 2014-12-29 ENCOUNTER — Encounter: Payer: Self-pay | Admitting: Family Medicine

## 2014-12-29 VITALS — BP 100/70 | HR 60 | Temp 97.3°F | Ht 65.35 in | Wt 130.0 lb

## 2014-12-29 DIAGNOSIS — Z0000 Encounter for general adult medical examination without abnormal findings: Secondary | ICD-10-CM

## 2014-12-29 MED ORDER — FLUOXETINE HCL 20 MG PO TABS: 20.0000 mg | ORAL_TABLET | Freq: Every day | ORAL | Status: DC

## 2014-12-29 NOTE — Progress Notes (Signed)
Pre visit review using our clinic review tool, if applicable. No additional management support is needed unless otherwise documented below in the visit note. 

## 2014-12-29 NOTE — Patient Instructions (Signed)
Confirm date of last colonoscopy and if > 10 years let us know when you are ready to schedule.

## 2014-12-29 NOTE — Progress Notes (Signed)
Subjective:    Patient ID: Vanessa Mccoy, female    DOB: 11-May-1955, 59 y.o.   MRN: [id number]  HPI Patient seen for complete physical. She sees gynecologist regularly for Pap smears and mammograms. Her last colonoscopy on record was 2006. She thinks this was more recent around 2008 goal try to confirm. She has past history of depression has taken fluoxetine intermittently. Requesting refill. 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  She's had some right knee pains following skiing injury last year. No swelling. No locking or giving way. Pain is mostly behind the kneecap region and worse at night. She has just recently started doing some quadriceps strengthening.  Past Medical History  Diagnosis Date  . DEPRESSION 11/13/2008  . Lumbago 11/13/2008  . ROTATOR CUFF SYNDROME 03/23/2010  . No pertinent past medical history   . PONV (postoperative nausea and vomiting)    Past Surgical History  Procedure Laterality Date  . Breast surgery  11/12    augmentation-bilat with saline implants  . Breast biopsy      many yrs ago-rt br cyst aspirated  . Appendectomy  1977    exp lap  . Breast biopsy  12/20/2011    Procedure: BREAST BIOPSY WITH NEEDLE LOCALIZATION;  Surgeon: Ernestene Mention, MD;  Location:  SURGERY CENTER;  Service: General;  Laterality: Left;  Left breast biospy with Needle localization    reports that she has never smoked. She has never used smokeless tobacco. She reports that she drinks alcohol. She reports that she does not use illicit drugs. family history includes Cancer in her maternal grandmother; Diabetes in her maternal grandfather; Heart disease in her brother and mother; Stroke in her maternal grandfather; Stroke (age of onset: 41) in her mother. Allergies  Allergen Reactions  . Codeine Sulfate     REACTION: GI upset  . Hydrocodone Nausea And Vomiting      Review of Systems  Constitutional: Negative  for fever, activity change, appetite change, fatigue and unexpected weight change.  HENT: Negative for ear pain, hearing loss, sore throat and trouble swallowing.   Eyes: Negative for visual disturbance.  Respiratory: Negative for cough and shortness of breath.   Cardiovascular: Negative for chest pain and palpitations.  Gastrointestinal: Negative for abdominal pain, diarrhea, constipation and blood in stool.  Endocrine: Negative for polydipsia and polyuria.  Genitourinary: Negative for dysuria and hematuria.  Musculoskeletal: Negative for myalgias and back pain.  Skin: Negative for rash.  Neurological: Negative for dizziness, syncope and headaches.  Hematological: Negative for adenopathy.  Psychiatric/Behavioral: Negative for confusion and dysphoric mood.       Objective:   Physical Exam  Constitutional: She is oriented to person, place, and time. She appears well-developed and well-nourished.  HENT:  Head: Normocephalic and atraumatic.  Eyes: EOM are normal. Pupils are equal, round, and reactive to light.  Neck: Normal range of motion. Neck supple. No thyromegaly present.  Cardiovascular: Normal rate, regular rhythm and normal heart sounds.   No murmur heard. Pulmonary/Chest: Breath sounds normal. No respiratory distress. She has no wheezes. She has no rales.  Abdominal: Soft. Bowel sounds are normal. She exhibits no distension and no mass. There is no tenderness. There is no rebound and no guarding.  Musculoskeletal: Normal range of motion. She exhibits no edema.  Right knee no effusion. No warmth. Full range of motion. No localized tenderness. Ligament testing is normal  Lymphadenopathy:    She has no  cervical adenopathy.  Neurological: She is alert and oriented to person, place, and time. She displays normal reflexes. No cranial nerve deficit.  Skin: No rash noted.  Psychiatric: She has a normal mood and affect. Her behavior is normal. Judgment and thought content normal.           Assessment & Plan:  Complete physical. Labs reviewed. No major concerns. Confirm date of last colonoscopy. If greater than 10 years needs to schedule follow-up. Tetanus up-to-date. Flu vaccine offered and declined. She will continue with GYN follow-up  Right knee pain. Question patellofemoral syndrome. Focus on quadriceps strengthening. If symptoms not improving over the next few weeks consider orthopedic referral.

## 2015-04-25 LAB — HM PAP SMEAR: HM Pap smear: NORMAL

## 2015-04-25 LAB — HM MAMMOGRAPHY: HM Mammogram: NORMAL (ref 0–4)

## 2015-05-10 ENCOUNTER — Other Ambulatory Visit: Payer: Self-pay | Admitting: Obstetrics and Gynecology

## 2015-05-11 LAB — CYTOLOGY - PAP

## 2015-07-08 ENCOUNTER — Ambulatory Visit (INDEPENDENT_AMBULATORY_CARE_PROVIDER_SITE_OTHER): Payer: BLUE CROSS/BLUE SHIELD | Admitting: Family Medicine

## 2015-07-08 VITALS — BP 100/80 | HR 88 | Temp 98.3°F | Ht 65.0 in | Wt 130.7 lb

## 2015-07-08 DIAGNOSIS — M5412 Radiculopathy, cervical region: Secondary | ICD-10-CM | POA: Diagnosis not present

## 2015-07-08 MED ORDER — FLUOXETINE HCL (PMDD) 10 MG PO CAPS: 1.0000 | ORAL_CAPSULE | Freq: Every day | ORAL | Status: DC

## 2015-07-08 MED ORDER — PREDNISONE 10 MG PO TABS: ORAL_TABLET | ORAL | Status: DC

## 2015-07-08 NOTE — Progress Notes (Signed)
   Subjective:    Patient ID: Vanessa Mccoy, female    DOB: 04/08/1956, 60 y.o.   MRN: [id number]  HPI Patient seen for new acute problem. About 3-4 week history of pain radiating from the base of her left neck into the left shoulder and down all the way to the hand at times. Pain is sometimes sharp and sometimes achy. 8 out of 10 severity at its worst. Interfering with sleep. She's tried Advil, ice, and heat without much relief. No prior history of neck difficulties. No chest pains. Occasional numbness and tingling sensation in her left hand and arm.  Past Medical History  Diagnosis Date  . DEPRESSION 11/13/2008  . Lumbago 11/13/2008  . ROTATOR CUFF SYNDROME 03/23/2010  . No pertinent past medical history   . PONV (postoperative nausea and vomiting)    Past Surgical History  Procedure Laterality Date  . Breast surgery  11/12    augmentation-bilat with saline implants  . Breast biopsy      many yrs ago-rt br cyst aspirated  . Appendectomy  1977    exp lap  . Breast biopsy  12/20/2011    Procedure: BREAST BIOPSY WITH NEEDLE LOCALIZATION;  Surgeon: Ernestene MentionHaywood M Ingram, MD;  Location: [facility];  Service: General;  Laterality: Left;  Left breast biospy with Needle localization    reports that she has never smoked. She has never used smokeless tobacco. She reports that she drinks alcohol. She reports that she does not use illicit drugs. family history includes Cancer in her maternal grandmother; Diabetes in her maternal grandfather; Heart disease in her brother and mother; Stroke in her maternal grandfather; Stroke (age of onset: 60) in her mother. Allergies  Allergen Reactions  . Codeine Sulfate     REACTION: GI upset  . Hydrocodone Nausea And Vomiting      Review of Systems  Respiratory: Negative for shortness of breath.   Cardiovascular: Negative for chest pain.  Neurological: Positive for numbness. Negative for weakness.       Objective:   Physical Exam    Constitutional: She appears well-developed and well-nourished. No distress.  Cardiovascular: Normal rate and regular rhythm.   Pulmonary/Chest: Effort normal and breath sounds normal. No respiratory distress. She has no wheezes. She has no rales.  Musculoskeletal:  Has fairly good range of motion neck but has pain with lateral bending as well as neck flexion and extension.  Neurological:  Symmetric reflexes upper extremities. No focal strength deficits. Normal sensory function to touch          Assessment & Plan:  Left cervical radiculopathy. Nonfocal exam neurologically. Trial of prednisone taper over the next week. Reviewed possible side effects. May need cervical spine films and possibly MRI to further investigate. Follow-up promptly for any progressive weakness or worsening pain

## 2015-07-08 NOTE — Progress Notes (Signed)
Pre visit review using our clinic review tool, if applicable. No additional management support is needed unless otherwise documented below in the visit note. 

## 2015-07-08 NOTE — Patient Instructions (Signed)

## 2015-10-21 DIAGNOSIS — F330 Major depressive disorder, recurrent, mild: Secondary | ICD-10-CM | POA: Diagnosis not present

## 2015-11-18 DIAGNOSIS — Z1211 Encounter for screening for malignant neoplasm of colon: Secondary | ICD-10-CM | POA: Diagnosis not present

## 2015-11-18 DIAGNOSIS — K5730 Diverticulosis of large intestine without perforation or abscess without bleeding: Secondary | ICD-10-CM | POA: Diagnosis not present

## 2015-11-18 LAB — HM COLONOSCOPY

## 2015-11-24 ENCOUNTER — Encounter: Payer: Self-pay | Admitting: Emergency Medicine

## 2016-01-02 IMAGING — MR MR HEAD WO/W CM
10 of 12 series · 35 of 48 positions shown · IV contrast (multihance)
Comparison: None.

CLINICAL DATA: 57-year-old female with headaches, vertigo, tingling
on the right side of the head for 4 months. Right side hearing loss.
Initial encounter.

EXAM:
MRI HEAD WITHOUT AND WITH CONTRAST
TECHNIQUE: Multiplanar, multiecho pulse sequences of the brain and surrounding
structures were obtained without and with intravenous contrast.
CONTRAST:  11mL MULTIHANCE GADOBENATE DIMEGLUMINE 529 MG/ML IV SOLN

[Series 2: T1 · sagittal · 5.0mm · 0.45mm/px · 3 of 19 slices shown]
[im 1/19]
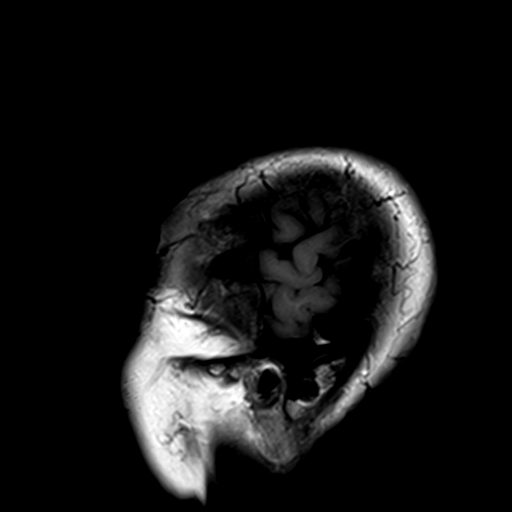
[im 10/19]
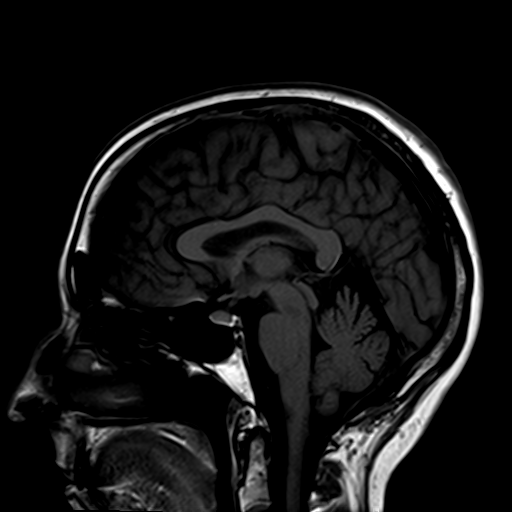
[im 19/19]
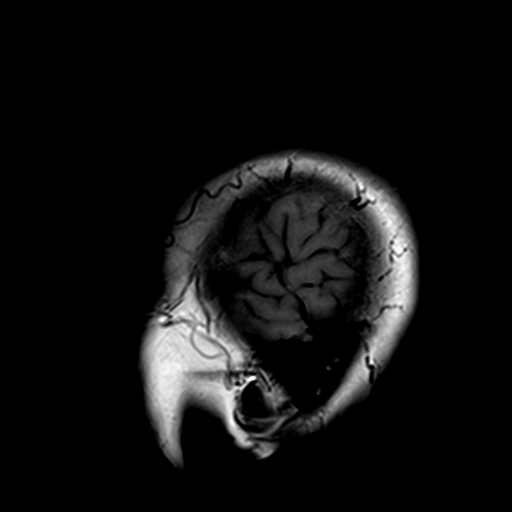

[Series 3: T2 · axial · 5.0mm · 0.45mm/px · z∈[-65,+71]mm · 3 of 22 slices shown (1 of 2)]
[im 1/22]
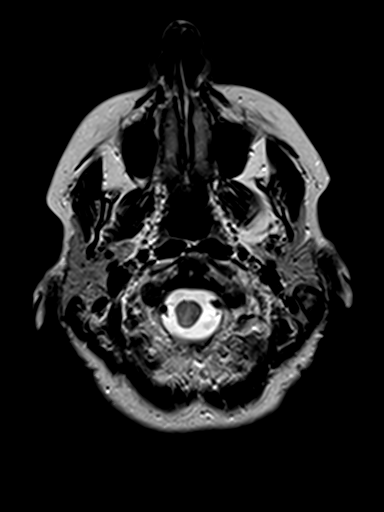
[im 11/22]
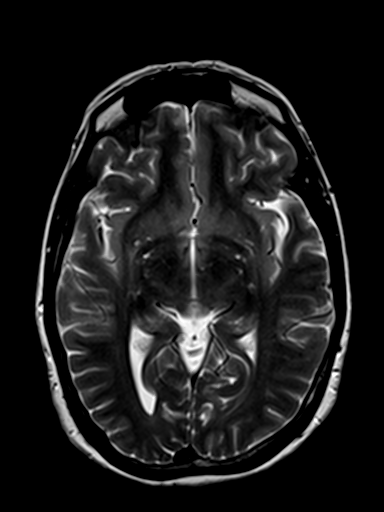
[im 22/22]
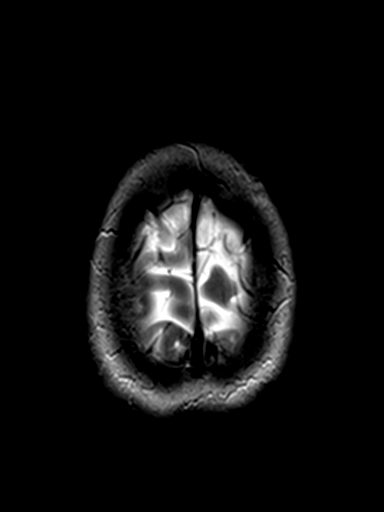

[Series 4: DWI · axial · 5.0mm · 0.90mm/px · z∈[-66,+70]mm · 4 of 44 slices shown (1 of 2)]
[im 1/44]
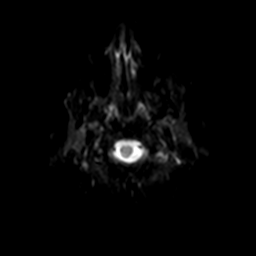
[im 15/44]
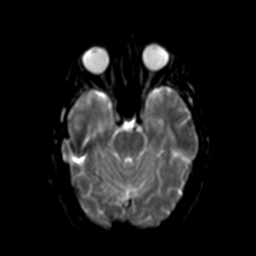
[im 29/44]
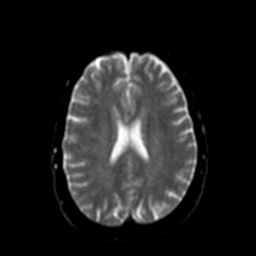
[im 44/44]
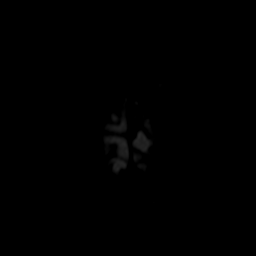

[Series 5: dwi_adc · axial · 5.0mm · 0.90mm/px · z∈[-66,+70]mm · 2 of 22 slices shown]
[im 1/22]
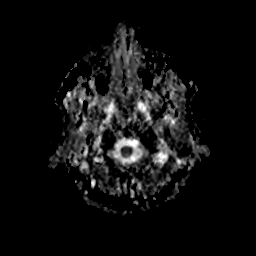
[im 22/22]
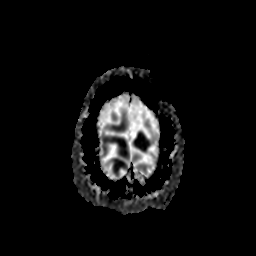

[Series 6: DWI · coronal · 5.0mm · 0.90mm/px · 6 of 64 slices shown (2 of 2)]
[im 1/64]
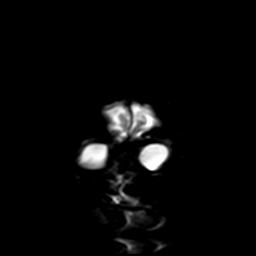
[im 13/64]
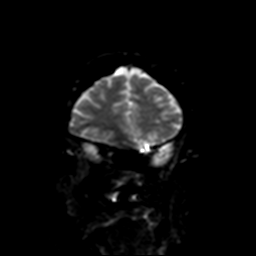
[im 26/64]
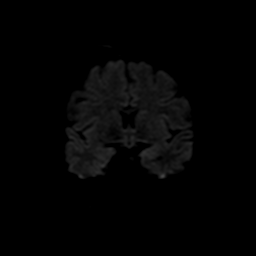
[im 38/64]
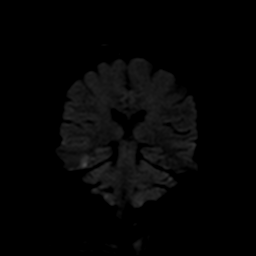
[im 51/64]
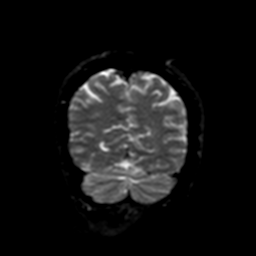
[im 64/64]
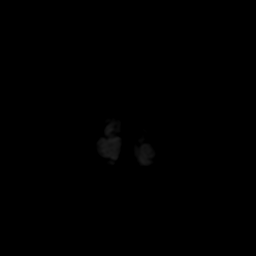

[Series 7: cor dwi_adc · coronal · 5.0mm · 0.90mm/px · 3 of 32 slices shown]
[im 1/32]
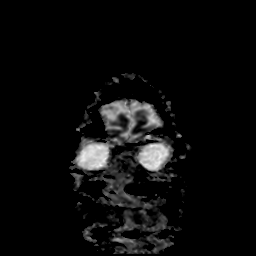
[im 16/32]
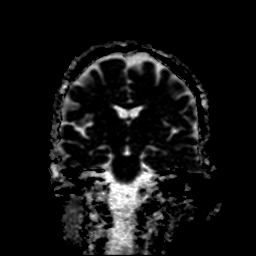
[im 32/32]
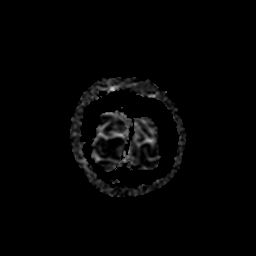

[Series 8: FLAIR · axial · 5.0mm · 0.45mm/px · z∈[-65,+71]mm · 2 of 22 slices shown]
[im 1/22]
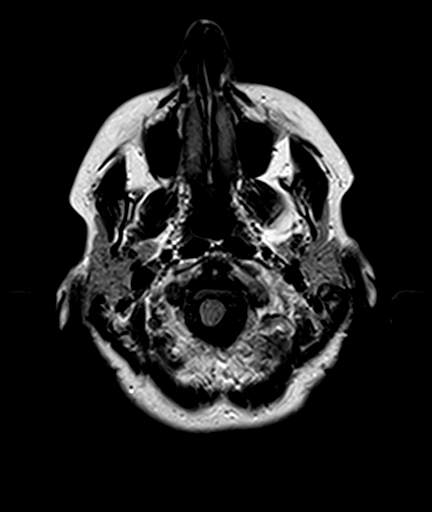
[im 22/22]
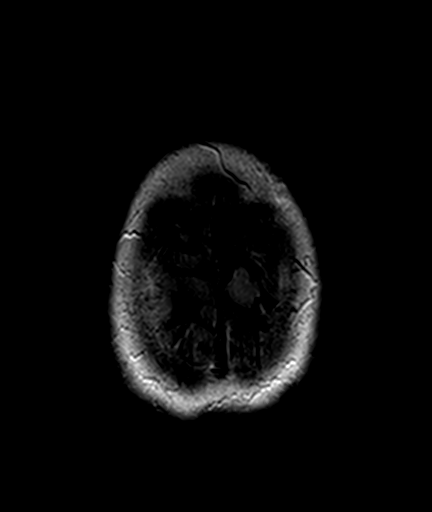

[Series 9: axial (person_name)1 volume · axial · 2.0mm · 0.45mm/px · z∈[-76,+82]mm · 7 of 80 slices shown]
[im 1/80]
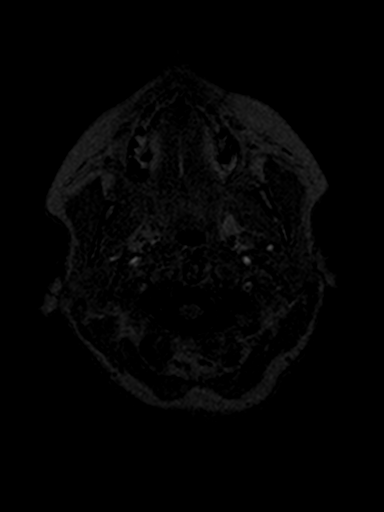
[im 14/80]
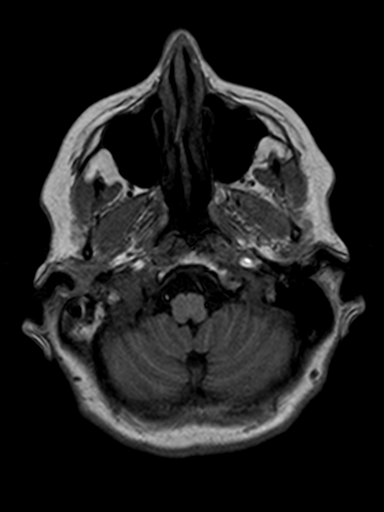
[im 27/80]
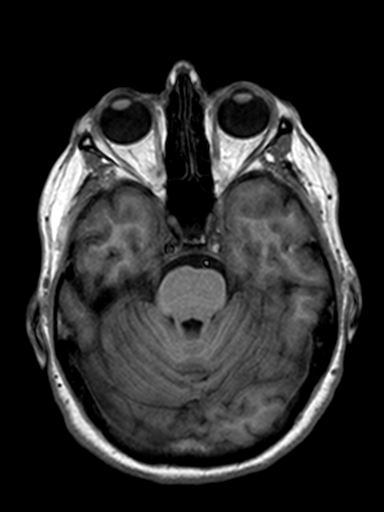
[im 40/80]
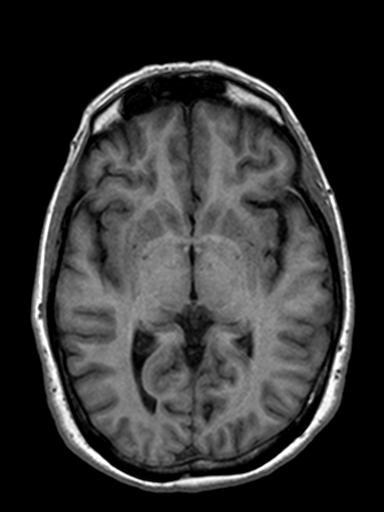
[im 53/80]
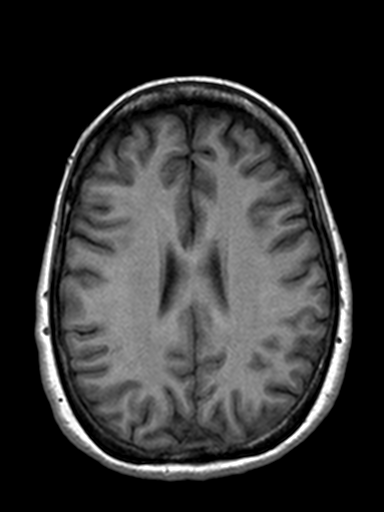
[im 66/80]
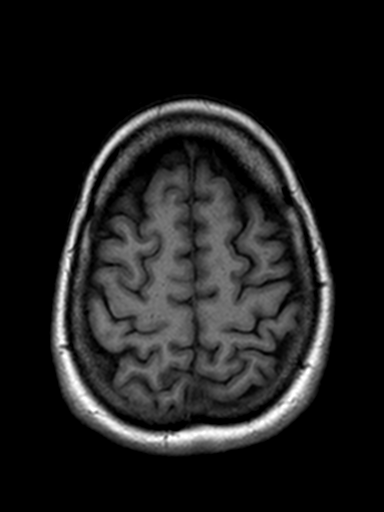
[im 80/80]
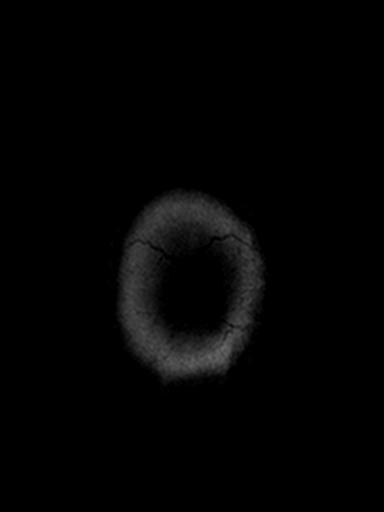

[Series 11: swi_images · axial · 2.0mm · 0.90mm/px · z∈[-76,-24]mm · 3 of 80 slices shown]
[im 1/80]
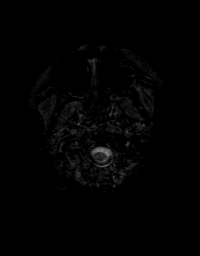
[im 14/80]
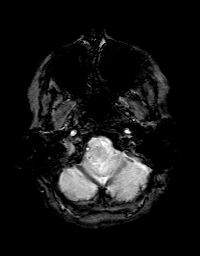
[im 27/80]
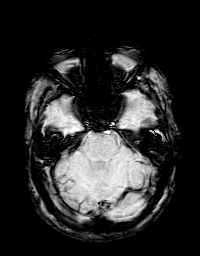

[Series 12: T2 · coronal · 5.0mm · 0.45mm/px · 2 of 24 slices shown (2 of 2)]
[im 1/24]
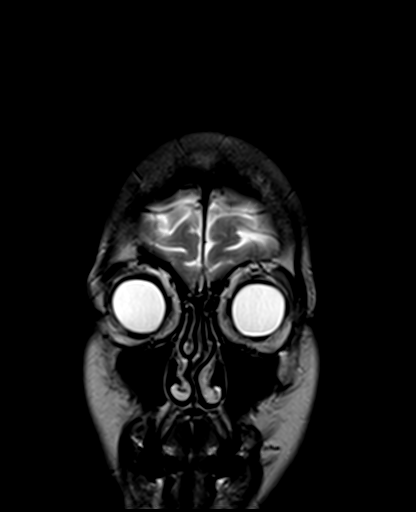
[im 24/24]
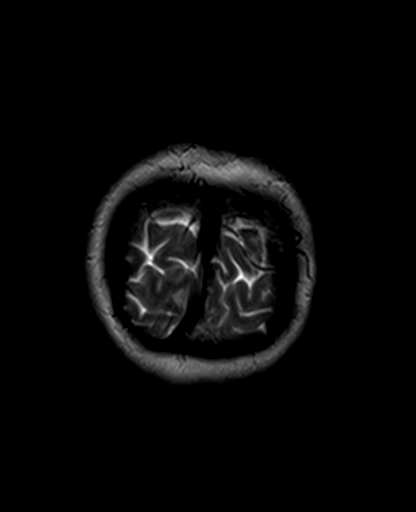

[35 of 48 positions shown; findings below may reference images not displayed]

FINDINGS: Cerebral volume is normal. No restricted diffusion to suggest acute
infarction. No midline shift, mass effect, evidence of mass lesion,
ventriculomegaly, extra-axial collection or acute intracranial
hemorrhage. Pituitary within normal limits. Major intracranial
vascular flow voids are preserved.

The cervicomedullary junction has a broad-based dorsal convex
contour (series 2, image 9 and series 3, image 1) which appears
unusual, but is not associated with any altered signal or abnormal
enhancement (series 13, image 7), therefore this is most likely a
normal anatomic variation. The ventral cervicomedullary junction and
visualized cervical spine are normal.

Gray and white matter signal is within normal limits for age
throughout the brain. No abnormal enhancement identified. Visible
internal auditory structures appear normal. Mastoids are clear.

Visualized orbit soft tissues are within normal limits. Paranasal
sinuses are clear. Visualized scalp soft tissues are within normal
limits. Normal bone marrow signal.
IMPRESSION: Normal for age MRI appearance of the brain.

## 2016-03-01 ENCOUNTER — Encounter: Payer: BLUE CROSS/BLUE SHIELD | Admitting: Family Medicine

## 2016-03-01 ENCOUNTER — Other Ambulatory Visit: Payer: BLUE CROSS/BLUE SHIELD

## 2016-03-01 ENCOUNTER — Other Ambulatory Visit (INDEPENDENT_AMBULATORY_CARE_PROVIDER_SITE_OTHER): Payer: 59

## 2016-03-01 DIAGNOSIS — Z0000 Encounter for general adult medical examination without abnormal findings: Secondary | ICD-10-CM

## 2016-03-01 LAB — CBC WITH DIFFERENTIAL/PLATELET
BASOS ABS: 0.1 10*3/uL (ref 0.0–0.1)
Basophils Relative: 1.6 % (ref 0.0–3.0)
EOS ABS: 0.1 10*3/uL (ref 0.0–0.7)
Eosinophils Relative: 1.8 % (ref 0.0–5.0)
HEMATOCRIT: 37.9 % (ref 36.0–46.0)
Hemoglobin: 13.1 g/dL (ref 12.0–15.0)
LYMPHS PCT: 39.5 % (ref 12.0–46.0)
Lymphs Abs: 1.9 10*3/uL (ref 0.7–4.0)
MCHC: 34.5 g/dL (ref 30.0–36.0)
MCV: 93.6 fl (ref 78.0–100.0)
MONOS PCT: 7.9 % (ref 3.0–12.0)
Monocytes Absolute: 0.4 10*3/uL (ref 0.1–1.0)
NEUTROS PCT: 49.2 % (ref 43.0–77.0)
Neutro Abs: 2.3 10*3/uL (ref 1.4–7.7)
PLATELETS: 262 10*3/uL (ref 150.0–400.0)
RBC: 4.05 Mil/uL (ref 3.87–5.11)
RDW: 13.1 % (ref 11.5–15.5)
WBC: 4.7 10*3/uL (ref 4.0–10.5)

## 2016-03-01 LAB — LIPID PANEL
CHOL/HDL RATIO: 2
CHOLESTEROL: 199 mg/dL (ref 0–200)
HDL: 81.3 mg/dL (ref >39.00)
LDL CALC: 109 mg/dL — AB (ref 0–99)
NONHDL: 117.97
Triglycerides: 46 mg/dL (ref 0.0–149.0)
VLDL: 9.2 mg/dL (ref 0.0–40.0)

## 2016-03-01 LAB — HEPATIC FUNCTION PANEL
ALBUMIN: 4.7 g/dL (ref 3.5–5.2)
ALK PHOS: 54 U/L (ref 39–117)
ALT: 16 U/L (ref 0–35)
AST: 23 U/L (ref 0–37)
BILIRUBIN DIRECT: 0 mg/dL (ref 0.0–0.3)
TOTAL PROTEIN: 7.7 g/dL (ref 6.0–8.3)
Total Bilirubin: 0.4 mg/dL (ref 0.2–1.2)

## 2016-03-01 LAB — BASIC METABOLIC PANEL
BUN: 16 mg/dL (ref 6–23)
CALCIUM: 10 mg/dL (ref 8.4–10.5)
CO2: 29 mEq/L (ref 19–32)
CREATININE: 0.89 mg/dL (ref 0.40–1.20)
Chloride: 103 mEq/L (ref 96–112)
GFR: 68.74 mL/min (ref >60.00)
Glucose, Bld: 102 mg/dL — ABNORMAL HIGH (ref 70–99)
Potassium: 4.5 mEq/L (ref 3.5–5.1)
SODIUM: 138 meq/L (ref 135–145)

## 2016-03-01 LAB — TSH: TSH: 1.21 u[IU]/mL (ref 0.35–4.50)

## 2016-03-06 ENCOUNTER — Encounter: Payer: BLUE CROSS/BLUE SHIELD | Admitting: Family Medicine

## 2016-03-13 ENCOUNTER — Ambulatory Visit (INDEPENDENT_AMBULATORY_CARE_PROVIDER_SITE_OTHER): Payer: 59 | Admitting: Family Medicine

## 2016-03-13 ENCOUNTER — Encounter: Payer: Self-pay | Admitting: Family Medicine

## 2016-03-13 VITALS — BP 110/80 | HR 79 | Temp 97.6°F | Ht 65.5 in | Wt 133.9 lb

## 2016-03-13 DIAGNOSIS — Z0000 Encounter for general adult medical examination without abnormal findings: Secondary | ICD-10-CM | POA: Diagnosis not present

## 2016-03-13 NOTE — Patient Instructions (Signed)
Check on coverage for shingles vaccine Consider DEXA (bone density scan) if covered by insurance.

## 2016-03-13 NOTE — Progress Notes (Signed)
Pre visit review using our clinic review tool, if applicable. No additional management support is needed unless otherwise documented below in the visit note. 

## 2016-03-13 NOTE — Progress Notes (Signed)
Subjective:     Patient ID: Vanessa Mccoy, female   DOB: 10/04/1955, 60 y.o.   MRN: [id number]  HPI Patient seen for physical exam. Generally very healthy. She's had past history of depression currently stable off medication. She is exercising some with walking. She still sees gynecologist. No history of shingles vaccine. Other immunizations up-to-date. She's not sure she's had previous bone density scan. Risk factors for osteoporosis include Caucasian, thin body habitus, and post menopause. No history of fracture. No recent chest pains. Still gets mammograms and Pap smears through GYN. Colonoscopy up-to-date  Past Medical History:  Diagnosis Date  . DEPRESSION 11/13/2008  . Lumbago 11/13/2008  . No pertinent past medical history   . PONV (postoperative nausea and vomiting)   . ROTATOR CUFF SYNDROME 03/23/2010   Past Surgical History:  Procedure Laterality Date  . APPENDECTOMY  1977   exp lap  . BREAST BIOPSY     many yrs ago-rt br cyst aspirated  . BREAST BIOPSY  12/20/2011   Procedure: BREAST BIOPSY WITH NEEDLE LOCALIZATION;  Surgeon: Ernestene MentionHaywood M Ingram, MD;  Location: [facility];  Service: General;  Laterality: Left;  Left breast biospy with Needle localization  . BREAST SURGERY  11/12   augmentation-bilat with saline implants    reports that she has never smoked. She has never used smokeless tobacco. She reports that she drinks alcohol. She reports that she does not use drugs. family history includes Cancer in her maternal grandmother; Diabetes in her maternal grandfather; Heart disease in her brother and mother; Stroke in her maternal grandfather; Stroke (age of onset: 8280) in her mother; Vision loss in her mother. Allergies  Allergen Reactions  . Codeine Sulfate     REACTION: GI upset  . Hydrocodone Nausea And Vomiting     Review of Systems  Constitutional: Negative for activity change, appetite change, fatigue, fever and unexpected weight change.  HENT: Negative  for ear pain, hearing loss, sore throat and trouble swallowing.   Eyes: Negative for visual disturbance.  Respiratory: Negative for cough and shortness of breath.   Cardiovascular: Negative for chest pain and palpitations.  Gastrointestinal: Negative for abdominal pain, blood in stool, constipation and diarrhea.  Endocrine: Negative for polydipsia and polyuria.  Genitourinary: Negative for dysuria and hematuria.  Musculoskeletal: Negative for arthralgias, back pain and myalgias.  Skin: Negative for rash.  Neurological: Negative for dizziness, syncope and headaches.  Hematological: Negative for adenopathy.  Psychiatric/Behavioral: Negative for confusion and dysphoric mood.       Objective:   Physical Exam  Constitutional: She is oriented to person, place, and time. She appears well-developed and well-nourished.  HENT:  Head: Normocephalic and atraumatic.  Eyes: EOM are normal. Pupils are equal, round, and reactive to light.  Neck: Normal range of motion. Neck supple. No thyromegaly present.  Cardiovascular: Normal rate, regular rhythm and normal heart sounds.   No murmur heard. Pulmonary/Chest: Breath sounds normal. No respiratory distress. She has no wheezes. She has no rales.  Abdominal: Soft. Bowel sounds are normal. She exhibits no distension and no mass. There is no tenderness. There is no rebound and no guarding.  Genitourinary:  Genitourinary Comments: Per GYN  Musculoskeletal: Normal range of motion. She exhibits no edema.  Lymphadenopathy:    She has no cervical adenopathy.  Neurological: She is alert and oriented to person, place, and time. She displays normal reflexes. No cranial nerve deficit.  Skin: No rash noted.  Psychiatric: She has a normal mood and affect.  Her behavior is normal. Judgment and thought content normal.       Assessment:     Physical exam. Labs reviewed. No major concerns. Lipids are excellent. Minimally elevated glucose 102    Plan:     -Check  on coverage for shingles vaccine and let us know if interested -Check on coverage for DEXA scan. Would consider given risk factors as above -Continue regular calcium and vitamin D and regular weightbearing exercise -She plans to continue with follow-up through GYN for Pap smears and mammograms  Kristian CoveyBruce W Burchette MD [facility]

## 2017-01-30 DIAGNOSIS — S83241A Other tear of medial meniscus, current injury, right knee, initial encounter: Secondary | ICD-10-CM | POA: Diagnosis not present

## 2017-02-06 DIAGNOSIS — M25561 Pain in right knee: Secondary | ICD-10-CM | POA: Diagnosis not present

## 2017-02-08 DIAGNOSIS — M25561 Pain in right knee: Secondary | ICD-10-CM | POA: Diagnosis not present

## 2017-04-09 ENCOUNTER — Encounter: Payer: 59 | Admitting: Family Medicine

## 2017-05-11 ENCOUNTER — Ambulatory Visit (INDEPENDENT_AMBULATORY_CARE_PROVIDER_SITE_OTHER): Payer: BLUE CROSS/BLUE SHIELD | Admitting: Family Medicine

## 2017-05-11 VITALS — BP 110/70 | HR 86 | Temp 98.2°F | Ht 66.0 in | Wt 120.3 lb

## 2017-05-11 DIAGNOSIS — Z0000 Encounter for general adult medical examination without abnormal findings: Secondary | ICD-10-CM

## 2017-05-11 LAB — CBC WITH DIFFERENTIAL/PLATELET
BASOS PCT: 1.9 % (ref 0.0–3.0)
Basophils Absolute: 0.1 10*3/uL (ref 0.0–0.1)
Eosinophils Absolute: 0.1 10*3/uL (ref 0.0–0.7)
Eosinophils Relative: 1.9 % (ref 0.0–5.0)
HEMATOCRIT: 41.7 % (ref 36.0–46.0)
HEMOGLOBIN: 14 g/dL (ref 12.0–15.0)
Lymphocytes Relative: 30.2 % (ref 12.0–46.0)
Lymphs Abs: 1.4 10*3/uL (ref 0.7–4.0)
MCHC: 33.6 g/dL (ref 30.0–36.0)
MCV: 97.1 fl (ref 78.0–100.0)
MONO ABS: 0.3 10*3/uL (ref 0.1–1.0)
MONOS PCT: 6.5 % (ref 3.0–12.0)
Neutro Abs: 2.7 10*3/uL (ref 1.4–7.7)
Neutrophils Relative %: 59.5 % (ref 43.0–77.0)
Platelets: 257 10*3/uL (ref 150.0–400.0)
RBC: 4.3 Mil/uL (ref 3.87–5.11)
RDW: 13.5 % (ref 11.5–15.5)
WBC: 4.6 10*3/uL (ref 4.0–10.5)

## 2017-05-11 LAB — HEPATIC FUNCTION PANEL
ALBUMIN: 4.7 g/dL (ref 3.5–5.2)
ALT: 13 U/L (ref 0–35)
AST: 25 U/L (ref 0–37)
Alkaline Phosphatase: 54 U/L (ref 39–117)
Bilirubin, Direct: 0.1 mg/dL (ref 0.0–0.3)
Total Bilirubin: 0.6 mg/dL (ref 0.2–1.2)
Total Protein: 7.2 g/dL (ref 6.0–8.3)

## 2017-05-11 LAB — BASIC METABOLIC PANEL
BUN: 17 mg/dL (ref 6–23)
CO2: 29 mEq/L (ref 19–32)
Calcium: 9.7 mg/dL (ref 8.4–10.5)
Chloride: 102 mEq/L (ref 96–112)
Creatinine, Ser: 0.87 mg/dL (ref 0.40–1.20)
GFR: 70.28 mL/min (ref >60.00)
Glucose, Bld: 90 mg/dL (ref 70–99)
POTASSIUM: 4.4 meq/L (ref 3.5–5.1)
SODIUM: 137 meq/L (ref 135–145)

## 2017-05-11 LAB — LIPID PANEL
CHOLESTEROL: 195 mg/dL (ref 0–200)
HDL: 76.9 mg/dL (ref >39.00)
LDL Cholesterol: 109 mg/dL — ABNORMAL HIGH (ref 0–99)
NonHDL: 117.72
Total CHOL/HDL Ratio: 3
Triglycerides: 42 mg/dL (ref 0.0–149.0)
VLDL: 8.4 mg/dL (ref 0.0–40.0)

## 2017-05-11 LAB — TSH: TSH: 0.94 u[IU]/mL (ref 0.35–4.50)

## 2017-05-11 NOTE — Patient Instructions (Signed)
Consider new shingles vaccine- Shingrix.  Check on insurance coverage Consider repeat DEXA with GYN

## 2017-05-11 NOTE — Progress Notes (Signed)
Subjective:     Patient ID: Vanessa Mccoy, female   DOB: 09/07/1955, 62 y.o.   MRN: [id number]  HPI Patient here for physical exam. She continues to see gynecologist yearly. She is getting regular mammograms-though we do not have records of last one. No history of shingles vaccine. No history of hepatitis C screening but low risk. She thinks she had bone density scan a few years ago. She has lost some weight intentionally over the past year by dietary change and exercise. She is currently between jobs. Nonsmoker. Tetanus up-to-date. Declines flu vaccine.  She has had some intermittent mild chest wall pain right side.  Has not noted any breast mass or adenopathy.    Past Medical History:  Diagnosis Date  . DEPRESSION 11/13/2008  . Lumbago 11/13/2008  . No pertinent past medical history   . PONV (postoperative nausea and vomiting)   . ROTATOR CUFF SYNDROME 03/23/2010   Past Surgical History:  Procedure Laterality Date  . APPENDECTOMY  1977   exp lap  . BREAST BIOPSY     many yrs ago-rt br cyst aspirated  . BREAST BIOPSY  12/20/2011   Procedure: BREAST BIOPSY WITH NEEDLE LOCALIZATION;  Surgeon: Ernestene MentionHaywood M Ingram, MD;  Location: [facility];  Service: General;  Laterality: Left;  Left breast biospy with Needle localization  . BREAST SURGERY  11/12   augmentation-bilat with saline implants    reports that  has never smoked. she has never used smokeless tobacco. She reports that she drinks alcohol. She reports that she does not use drugs. family history includes Cancer in her maternal grandmother; Diabetes in her maternal grandfather; Heart disease in her brother and mother; Stroke in her maternal grandfather; Stroke (age of onset: 5980) in her mother; Vision loss in her mother. Allergies  Allergen Reactions  . Codeine Sulfate     REACTION: GI upset  . Hydrocodone Nausea And Vomiting     Review of Systems  Constitutional: Negative for activity change, appetite change, fatigue,  fever and unexpected weight change.  HENT: Negative for ear pain, hearing loss, sore throat and trouble swallowing.   Eyes: Negative for visual disturbance.  Respiratory: Negative for cough and shortness of breath.   Cardiovascular: Negative for chest pain and palpitations.  Gastrointestinal: Negative for abdominal pain, blood in stool, constipation and diarrhea.  Genitourinary: Negative for dysuria and hematuria.  Musculoskeletal: Negative for arthralgias, back pain and myalgias.  Skin: Negative for rash.  Neurological: Negative for dizziness, syncope and headaches.  Hematological: Negative for adenopathy.  Psychiatric/Behavioral: Negative for confusion and dysphoric mood.       Objective:   Physical Exam  Constitutional: She is oriented to person, place, and time. She appears well-developed and well-nourished.  HENT:  Head: Normocephalic and atraumatic.  Eyes: EOM are normal. Pupils are equal, round, and reactive to light.  Neck: Normal range of motion. Neck supple. No thyromegaly present.  Cardiovascular: Normal rate, regular rhythm and normal heart sounds.  No murmur heard. Pulmonary/Chest: Breath sounds normal. No respiratory distress. She has no wheezes. She has no rales.  Breast exam-symmetric. She's had implants. No mass. No localized tenderness. No axillary adenopathy.  Abdominal: Soft. Bowel sounds are normal. She exhibits no distension and no mass. There is no tenderness. There is no rebound and no guarding.  Musculoskeletal: Normal range of motion. She exhibits no edema.  Lymphadenopathy:    She has no cervical adenopathy.  Neurological: She is alert and oriented to person, place, and time.  She displays normal reflexes. No cranial nerve deficit.  Skin: No rash noted.  Psychiatric: She has a normal mood and affect. Her behavior is normal. Judgment and thought content normal.       Assessment:     Physical exam-generally healthy 62 year old female. The following issues  were addressed    Plan:     -Obtain labs including hepatitis C antibody -She will check on coverage for shingles vaccine -Discuss with her GYN possible repeat DEXA scan  Kristian Covey MD [facility] at [facility]

## 2017-05-12 LAB — HEPATITIS C ANTIBODY
Hepatitis C Ab: NONREACTIVE
SIGNAL TO CUT-OFF: 0.2 (ref <1.00)

## 2017-09-18 DIAGNOSIS — Z01419 Encounter for gynecological examination (general) (routine) without abnormal findings: Secondary | ICD-10-CM | POA: Diagnosis not present

## 2017-09-18 DIAGNOSIS — Z124 Encounter for screening for malignant neoplasm of cervix: Secondary | ICD-10-CM | POA: Diagnosis not present

## 2017-09-18 DIAGNOSIS — Z1231 Encounter for screening mammogram for malignant neoplasm of breast: Secondary | ICD-10-CM | POA: Diagnosis not present

## 2017-09-18 DIAGNOSIS — Z681 Body mass index (BMI) 19 or less, adult: Secondary | ICD-10-CM | POA: Diagnosis not present

## 2017-10-03 DIAGNOSIS — J069 Acute upper respiratory infection, unspecified: Secondary | ICD-10-CM | POA: Diagnosis not present

## 2017-10-19 ENCOUNTER — Ambulatory Visit: Payer: BLUE CROSS/BLUE SHIELD | Admitting: Family Medicine

## 2017-10-19 ENCOUNTER — Encounter: Payer: Self-pay | Admitting: Family Medicine

## 2017-10-19 VITALS — BP 100/78 | HR 54 | Temp 97.8°F | Wt 119.0 lb

## 2017-10-19 DIAGNOSIS — H6981 Other specified disorders of Eustachian tube, right ear: Secondary | ICD-10-CM

## 2017-10-19 DIAGNOSIS — R0982 Postnasal drip: Secondary | ICD-10-CM

## 2017-10-19 MED ORDER — FLUTICASONE PROPIONATE 50 MCG/ACT NA SUSP
1.0000 | Freq: Every day | NASAL | 0 refills | Status: DC
Start: 2017-10-19 — End: 2018-07-03

## 2017-10-19 NOTE — Progress Notes (Signed)
Subjective:    Patient ID: Rodney BoozeGail L Catino, female    DOB: 11/01/1955, 62 y.o.   MRN: [id number]  No chief complaint on file.   HPI Patient was seen today for acute concern.  Pt endorses dry cough, fatigue, ear congestion x 10 days.  Pt initially had sore throat and rhinorrhea when symptoms started.  Pt unsure of sick contacts, states [place] to [place] last week prior to being sick.  Pt has tried antihistamines for her symptoms.  Patient denies fever, chills, nausea, vomiting, history of seasonal allergies  Past Medical History:  Diagnosis Date  . DEPRESSION 11/13/2008  . Lumbago 11/13/2008  . No pertinent past medical history   . PONV (postoperative nausea and vomiting)   . ROTATOR CUFF SYNDROME 03/23/2010    Allergies  Allergen Reactions  . Codeine Sulfate     REACTION: GI upset  . Hydrocodone Nausea And Vomiting    ROS General: Denies fever, chills, night sweats, changes in weight, changes in appetite + fatigue HEENT: Denies headaches, ear pain, changes in vision, rhinorrhea, sore throat  + ear congestion/ache CV: Denies CP, palpitations, SOB, orthopnea Pulm: Denies SOB, wheezing  + dry cough GI: Denies abdominal pain, nausea, vomiting, diarrhea, constipation GU: Denies dysuria, hematuria, frequency, vaginal discharge Msk: Denies muscle cramps, joint pains Neuro: Denies weakness, numbness, tingling Skin: Denies rashes, bruising Psych: Denies depression, anxiety, hallucinations     Objective:    Blood pressure 100/78, pulse (!) 54, temperature 97.8 F (36.6 C), temperature source Oral, weight 119 lb (54 kg), SpO2 97 %.   Gen. Pleasant, well-nourished, in no distress, normal affect   HEENT: Kemp/AT, face symmetric, no scleral icterus, PERRLA, nares patent without drainage, pharynx with post nasal drainage, no erythema or exudate.  TMs full bilaterally.  No cervical lymphadenopathy. Lungs: no accessory muscle use, CTAB, no wheezes or rales Cardiovascular: RRR, no m/r/g, no  peripheral edema Abdomen: BS present, soft, NT/ND Neuro:  A&Ox3, CN II-XII intact, normal gait   Wt Readings from Last 3 Encounters:  10/19/17 119 lb (54 kg)  05/11/17 120 lb 4.8 oz (54.6 kg)  03/13/16 133 lb 14.4 oz (60.7 kg)    Lab Results  Component Value Date   WBC 4.6 05/11/2017   HGB 14.0 05/11/2017   HCT 41.7 05/11/2017   PLT 257.0 05/11/2017   GLUCOSE 90 05/11/2017   CHOL 195 05/11/2017   TRIG 42.0 05/11/2017   HDL 76.90 05/11/2017   LDLCALC 109 (H) 05/11/2017   ALT 13 05/11/2017   AST 25 05/11/2017   NA 137 05/11/2017   K 4.4 05/11/2017   CL 102 05/11/2017   CREATININE 0.87 05/11/2017   BUN 17 05/11/2017   CO2 29 05/11/2017   TSH 0.94 05/11/2017    Assessment/Plan:  Dysfunction of right eustachian tube -Given handout - Plan: fluticasone (FLONASE) 50 MCG/ACT nasal spray  Post-nasal drip  -Okay to continue antihistamine allergy pill -Given handout - Plan: fluticasone (FLONASE) 50 MCG/ACT nasal spray  Follow-up PRN  Abbe AmsterdamShannon Kyron Schlitt, MD

## 2017-10-19 NOTE — Patient Instructions (Signed)
Eustachian Tube Dysfunction The eustachian tube connects the middle ear to the back of the nose. It regulates air pressure in the middle ear by allowing air to move between the ear and nose. It also helps to drain fluid from the middle ear space. When the eustachian tube does not function properly, air pressure, fluid, or both can build up in the middle ear. Eustachian tube dysfunction can affect one or both ears. What are the causes? This condition happens when the eustachian tube becomes blocked or cannot open normally. This may result from:  Ear infections.  Colds and other upper respiratory infections.  Allergies.  Irritation, such as from cigarette smoke or acid from the stomach coming up into the esophagus (gastroesophageal reflux).  Sudden changes in air pressure, such as from descending in an airplane.  Abnormal growths in the nose or throat, such as nasal polyps, tumors, or enlarged tissue at the back of the throat (adenoids).  What increases the risk? This condition may be more likely to develop in people who smoke and people who are overweight. Eustachian tube dysfunction may also be more likely to develop in children, especially children who have:  Certain birth defects of the mouth, such as cleft palate.  Large tonsils and adenoids.  What are the signs or symptoms? Symptoms of this condition may include:  A feeling of fullness in the ear.  Ear pain.  Clicking or popping noises in the ear.  Ringing in the ear.  Hearing loss.  Loss of balance.  Symptoms may get worse when the air pressure around you changes, such as when you travel to an area of high elevation or fly on an airplane. How is this diagnosed? This condition may be diagnosed based on:  Your symptoms.  A physical exam of your ear, nose, and throat.  Tests, such as those that measure: ? The movement of your eardrum (tympanogram). ? Your hearing (audiometry).  How is this treated? Treatment  depends on the cause and severity of your condition. If your symptoms are mild, you may be able to relieve your symptoms by moving air into ("popping") your ears. If you have symptoms of fluid in your ears, treatment may include:  Decongestants.  Antihistamines.  Nasal sprays or ear drops that contain medicines that reduce swelling (steroids).  In some cases, you may need to have a procedure to drain the fluid in your eardrum (myringotomy). In this procedure, a small tube is placed in the eardrum to:  Drain the fluid.  Restore the air in the middle ear space.  Follow these instructions at home:  Take over-the-counter and prescription medicines only as told by your health care provider.  Use techniques to help pop your ears as recommended by your health care provider. These may include: ? Chewing gum. ? Yawning. ? Frequent, forceful swallowing. ? Closing your mouth, holding your nose closed, and gently blowing as if you are trying to blow air out of your nose.  Do not do any of the following until your health care provider approves: ? Travel to high altitudes. ? Fly in airplanes. ? Work in a pressurized cabin or room. ? Scuba dive.  Keep your ears dry. Dry your ears completely after showering or bathing.  Do not smoke.  Keep all follow-up visits as told by your health care provider. This is important. Contact a health care provider if:  Your symptoms do not go away after treatment.  Your symptoms come back after treatment.  You are   unable to pop your ears.  You have: ? A fever. ? Pain in your ear. ? Pain in your head or neck. ? Fluid draining from your ear.  Your hearing suddenly changes.  You become very dizzy.  You lose your balance. This information is not intended to replace advice given to you by your health care provider. Make sure you discuss any questions you have with your health care provider. Document Released: 05/07/2015 Document Revised: 09/16/2015  Document Reviewed: 04/29/2014 Elsevier Interactive Patient Education  2018 [employer]  Postnasal Drip Postnasal drip is the feeling of mucus going down the back of your throat. Mucus is a slimy substance that moistens and cleans your nose and throat, as well as the air pockets in face bones near your forehead and cheeks (sinuses). Small amounts of mucus pass from your nose and sinuses down the back of your throat all the time. This is normal. When you produce too much mucus or the mucus gets too thick, you can feel it. Some common causes of postnasal drip include:  Having more mucus because of: ? A cold or the flu. ? Allergies. ? Cold air. ? Certain medicines.  Having more mucus that is thicker because of: ? A sinus or nasal infection. ? Dry air. ? A food allergy.  Follow these instructions at home: Relieving discomfort  Gargle with a salt-water mixture 3-4 times a day or as needed. To make a salt-water mixture, completely dissolve -1 tsp of salt in 1 cup of warm water.  If the air in your home is dry, use a humidifier to add moisture to the air.  Use a saline spray or container (neti pot) to flush out the nose (nasal irrigation). These methods can help clear away mucus and keep the nasal passages moist. General instructions  Take over-the-counter and prescription medicines only as told by your health care provider.  Follow instructions from your health care provider about eating or drinking restrictions. You may need to avoid caffeine.  Avoid things that you know you are allergic to (allergens), like dust, mold, pollen, pets, or certain foods.  Drink enough fluid to keep your urine pale yellow.  Keep all follow-up visits as told by your health care provider. This is important. Contact a health care provider if:  You have a fever.  You have a sore throat.  You have difficulty swallowing.  You have headache.  You have sinus pain.  You have a cough that does not go  away.  The mucus from your nose becomes thick and is green or yellow in color.  You have cold or flu symptoms that last more than 10 days. Summary  Postnasal drip is the feeling of mucus going down the back of your throat.  If your health care provider approves, use nasal irrigation or a nasal spray 2?4 times a day.  Avoid things that you know you are allergic to (allergens), like dust, mold, pollen, pets, or certain foods. This information is not intended to replace advice given to you by your health care provider. Make sure you discuss any questions you have with your health care provider. Document Released: 07/24/2016 Document Revised: 07/24/2016 Document Reviewed: 07/24/2016 Elsevier Interactive Patient Education  2018 [employer]  

## 2018-07-03 ENCOUNTER — Encounter: Payer: Self-pay | Admitting: Family Medicine

## 2018-07-03 ENCOUNTER — Ambulatory Visit (INDEPENDENT_AMBULATORY_CARE_PROVIDER_SITE_OTHER): Payer: BLUE CROSS/BLUE SHIELD | Admitting: Family Medicine

## 2018-07-03 ENCOUNTER — Other Ambulatory Visit: Payer: Self-pay

## 2018-07-03 VITALS — BP 124/76 | HR 64 | Temp 97.9°F | Ht 65.1 in | Wt 123.1 lb

## 2018-07-03 DIAGNOSIS — M8580 Other specified disorders of bone density and structure, unspecified site: Secondary | ICD-10-CM | POA: Diagnosis not present

## 2018-07-03 DIAGNOSIS — Z0000 Encounter for general adult medical examination without abnormal findings: Secondary | ICD-10-CM | POA: Diagnosis not present

## 2018-07-03 LAB — CBC WITH DIFFERENTIAL/PLATELET
Basophils Absolute: 0.1 10*3/uL (ref 0.0–0.1)
Basophils Relative: 1.6 % (ref 0.0–3.0)
Eosinophils Absolute: 0.1 10*3/uL (ref 0.0–0.7)
Eosinophils Relative: 2.6 % (ref 0.0–5.0)
HCT: 38 % (ref 36.0–46.0)
Hemoglobin: 13.2 g/dL (ref 12.0–15.0)
Lymphocytes Relative: 37.3 % (ref 12.0–46.0)
Lymphs Abs: 1.9 10*3/uL (ref 0.7–4.0)
MCHC: 34.6 g/dL (ref 30.0–36.0)
MCV: 96.3 fl (ref 78.0–100.0)
Monocytes Absolute: 0.4 10*3/uL (ref 0.1–1.0)
Monocytes Relative: 8.2 % (ref 3.0–12.0)
Neutro Abs: 2.5 10*3/uL (ref 1.4–7.7)
Neutrophils Relative %: 50.3 % (ref 43.0–77.0)
Platelets: 262 10*3/uL (ref 150.0–400.0)
RBC: 3.95 Mil/uL (ref 3.87–5.11)
RDW: 13.3 % (ref 11.5–15.5)
WBC: 5 10*3/uL (ref 4.0–10.5)

## 2018-07-03 LAB — TSH: TSH: 0.92 u[IU]/mL (ref 0.35–4.50)

## 2018-07-03 LAB — HEPATIC FUNCTION PANEL
ALT: 14 U/L (ref 0–35)
AST: 24 U/L (ref 0–37)
Albumin: 4.5 g/dL (ref 3.5–5.2)
Alkaline Phosphatase: 52 U/L (ref 39–117)
Bilirubin, Direct: 0.1 mg/dL (ref 0.0–0.3)
Total Bilirubin: 0.5 mg/dL (ref 0.2–1.2)
Total Protein: 6.9 g/dL (ref 6.0–8.3)

## 2018-07-03 LAB — LIPID PANEL
Cholesterol: 190 mg/dL (ref 0–200)
HDL: 85.9 mg/dL (ref >39.00)
LDL Cholesterol: 94 mg/dL (ref 0–99)
NonHDL: 103.86
Total CHOL/HDL Ratio: 2
Triglycerides: 51 mg/dL (ref 0.0–149.0)
VLDL: 10.2 mg/dL (ref 0.0–40.0)

## 2018-07-03 LAB — BASIC METABOLIC PANEL
BUN: 18 mg/dL (ref 6–23)
CO2: 27 mEq/L (ref 19–32)
Calcium: 9.4 mg/dL (ref 8.4–10.5)
Chloride: 101 mEq/L (ref 96–112)
Creatinine, Ser: 0.78 mg/dL (ref 0.40–1.20)
GFR: 74.73 mL/min (ref >60.00)
GLUCOSE: 105 mg/dL — AB (ref 70–99)
Potassium: 3.9 mEq/L (ref 3.5–5.1)
SODIUM: 136 meq/L (ref 135–145)

## 2018-07-03 LAB — VITAMIN D 25 HYDROXY (VIT D DEFICIENCY, FRACTURES): VITD: 23.33 ng/mL — ABNORMAL LOW (ref 30.00–100.00)

## 2018-07-03 NOTE — Patient Instructions (Signed)
Set up DEXA at [facility].

## 2018-07-03 NOTE — Progress Notes (Signed)
Subjective:     Patient ID: Vanessa Mccoy, female   DOB: Mar 31, 1956, 63 y.o.   MRN: [id number]  HPI Patient seen for physical exam.  Generally very healthy.  She had osteopenia by bone scan back in 2014 but no follow-up since then.  She stays quite active.  She does not take consistent calcium or vitamin D.  She sees gynecologist yearly.  She gets regular Pap smears and mammograms through them.  She had colonoscopy couple years ago and that is up-to-date.  Previous hepatitis C screen negative.  She had newer shingles vaccine last year.  She has a daughter lives in [place] ([place]) and son who lives in [place].  She may be moving to [place] soon to be near her son and grandson.  13-month-old grandson.  Past Medical History:  Diagnosis Date  . DEPRESSION 11/13/2008  . Lumbago 11/13/2008  . No pertinent past medical history   . PONV (postoperative nausea and vomiting)   . ROTATOR CUFF SYNDROME 03/23/2010   Past Surgical History:  Procedure Laterality Date  . APPENDECTOMY  1977   exp lap  . BREAST BIOPSY     many yrs ago-rt br cyst aspirated  . BREAST BIOPSY  12/20/2011   Procedure: BREAST BIOPSY WITH NEEDLE LOCALIZATION;  Surgeon: Ernestene Mention, MD;  Location: [facility];  Service: General;  Laterality: Left;  Left breast biospy with Needle localization  . BREAST SURGERY  11/12   augmentation-bilat with saline implants    reports that she has never smoked. She has never used smokeless tobacco. She reports current alcohol use. She reports that she does not use drugs. family history includes Cancer in her maternal grandmother; Diabetes in her maternal grandfather; Heart disease in her brother and mother; Stroke in her maternal grandfather; Stroke (age of onset: 11) in her mother; Vision loss in her mother. Allergies  Allergen Reactions  . Codeine Sulfate     REACTION: GI upset  . Hydrocodone Nausea And Vomiting     Review of Systems  Constitutional:  Negative for activity change, appetite change, fatigue, fever and unexpected weight change.  HENT: Negative for ear pain, hearing loss, sore throat and trouble swallowing.   Eyes: Negative for visual disturbance.  Respiratory: Negative for cough and shortness of breath.   Cardiovascular: Negative for chest pain and palpitations.  Gastrointestinal: Negative for abdominal pain, blood in stool, constipation and diarrhea.  Genitourinary: Negative for dysuria and hematuria.  Musculoskeletal: Negative for arthralgias, back pain and myalgias.  Skin: Negative for rash.  Neurological: Negative for dizziness, syncope and headaches.  Hematological: Negative for adenopathy.  Psychiatric/Behavioral: Negative for confusion and dysphoric mood.       Objective:   Physical Exam Constitutional:      Appearance: She is well-developed.  HENT:     Head: Normocephalic and atraumatic.  Eyes:     Pupils: Pupils are equal, round, and reactive to light.  Neck:     Musculoskeletal: Normal range of motion and neck supple.     Thyroid: No thyromegaly.  Cardiovascular:     Rate and Rhythm: Normal rate and regular rhythm.     Heart sounds: Normal heart sounds. No murmur.  Pulmonary:     Effort: No respiratory distress.     Breath sounds: Normal breath sounds. No wheezing or rales.  Abdominal:     General: Bowel sounds are normal. There is no distension.     Palpations: Abdomen is soft. There is no mass.  Tenderness: There is no abdominal tenderness. There is no guarding or rebound.  Musculoskeletal: Normal range of motion.  Lymphadenopathy:     Cervical: No cervical adenopathy.  Skin:    Findings: No rash.  Neurological:     Mental Status: She is alert and oriented to person, place, and time.     Cranial Nerves: No cranial nerve deficit.     Deep Tendon Reflexes: Reflexes normal.  Psychiatric:        Behavior: Behavior normal.        Thought Content: Thought content normal.        Judgment:  Judgment normal.        Assessment:     Physical exam.  Past has history of osteopenia with -1.1 T score 2014.  No follow-up since then.    Plan:     -Recommend follow-up DEXA scan -Obtain screening labs -Recommend daily calcium 1200 mg and vitamin D thousand international units and regular weightbearing exercise -She will continue GYN follow-up regarding her mammograms and Pap smears  Kristian Covey MD [facility]

## 2018-07-05 ENCOUNTER — Inpatient Hospital Stay: Admission: RE | Admit: 2018-07-05 | Payer: BLUE CROSS/BLUE SHIELD | Source: Ambulatory Visit

## 2018-07-10 ENCOUNTER — Ambulatory Visit (INDEPENDENT_AMBULATORY_CARE_PROVIDER_SITE_OTHER)
Admission: RE | Admit: 2018-07-10 | Discharge: 2018-07-10 | Disposition: A | Discharge status: Home / Self Care | Payer: BLUE CROSS/BLUE SHIELD | Source: Ambulatory Visit | Attending: Family Medicine | Admitting: Family Medicine

## 2018-07-10 ENCOUNTER — Other Ambulatory Visit: Payer: Self-pay

## 2018-07-10 DIAGNOSIS — Z780 Asymptomatic menopausal state: Secondary | ICD-10-CM

## 2018-07-10 DIAGNOSIS — M8580 Other specified disorders of bone density and structure, unspecified site: Secondary | ICD-10-CM

## 2018-07-14 DIAGNOSIS — Z780 Asymptomatic menopausal state: Secondary | ICD-10-CM | POA: Diagnosis not present

## 2018-12-05 ENCOUNTER — Encounter: Payer: Self-pay | Admitting: Family Medicine

## 2018-12-06 ENCOUNTER — Other Ambulatory Visit: Payer: Self-pay

## 2018-12-06 MED ORDER — FLUOXETINE HCL 10 MG PO CAPS: ORAL_CAPSULE | ORAL | 1 refills | Status: AC

## 2019-03-07 DIAGNOSIS — Z23 Encounter for immunization: Secondary | ICD-10-CM | POA: Diagnosis not present

## 2019-03-14 DIAGNOSIS — F320 Major depressive disorder, single episode, mild: Secondary | ICD-10-CM | POA: Diagnosis not present

## 2019-03-14 DIAGNOSIS — R079 Chest pain, unspecified: Secondary | ICD-10-CM | POA: Diagnosis not present

## 2019-04-23 ENCOUNTER — Encounter: Payer: Self-pay | Admitting: Family Medicine

## 2019-05-30 DIAGNOSIS — M25561 Pain in right knee: Secondary | ICD-10-CM | POA: Diagnosis not present

## 2019-11-10 DIAGNOSIS — Z1231 Encounter for screening mammogram for malignant neoplasm of breast: Secondary | ICD-10-CM | POA: Diagnosis not present
